# Patient Record
Sex: Male | Born: 1955 | Race: White | Hispanic: No | Marital: Single | Smoking: Never smoker
Health system: Southern US, Community
[De-identification: ages and names within clinical notes are randomized; demographics above are authoritative.]

## PROBLEM LIST (undated history)

## (undated) DIAGNOSIS — F29 Unspecified psychosis not due to a substance or known physiological condition: Secondary | ICD-10-CM

## (undated) DIAGNOSIS — S069X9A Unspecified intracranial injury with loss of consciousness of unspecified duration, initial encounter: Secondary | ICD-10-CM

## (undated) DIAGNOSIS — F209 Schizophrenia, unspecified: Secondary | ICD-10-CM

## (undated) DIAGNOSIS — F329 Major depressive disorder, single episode, unspecified: Secondary | ICD-10-CM

## (undated) DIAGNOSIS — F32A Depression, unspecified: Secondary | ICD-10-CM

## (undated) DIAGNOSIS — E785 Hyperlipidemia, unspecified: Secondary | ICD-10-CM

## (undated) DIAGNOSIS — N400 Enlarged prostate without lower urinary tract symptoms: Secondary | ICD-10-CM

## (undated) DIAGNOSIS — J45909 Unspecified asthma, uncomplicated: Secondary | ICD-10-CM

## (undated) DIAGNOSIS — I8390 Asymptomatic varicose veins of unspecified lower extremity: Secondary | ICD-10-CM

## (undated) DIAGNOSIS — S069XAA Unspecified intracranial injury with loss of consciousness status unknown, initial encounter: Secondary | ICD-10-CM

## (undated) HISTORY — PX: NASAL SINUS SURGERY: SHX719

## (undated) HISTORY — PX: TONSILLECTOMY: SUR1361

---

## 2013-08-24 ENCOUNTER — Emergency Department (HOSPITAL_COMMUNITY)
Admission: EM | Admit: 2013-08-24 | Discharge: 2013-08-24 | Disposition: A | Discharge status: Home / Self Care | Payer: Self-pay | Attending: Emergency Medicine | Admitting: Emergency Medicine

## 2013-08-24 ENCOUNTER — Encounter (HOSPITAL_COMMUNITY): Payer: Self-pay | Admitting: Emergency Medicine

## 2013-08-24 DIAGNOSIS — F329 Major depressive disorder, single episode, unspecified: Secondary | ICD-10-CM | POA: Insufficient documentation

## 2013-08-24 DIAGNOSIS — Z8639 Personal history of other endocrine, nutritional and metabolic disease: Secondary | ICD-10-CM | POA: Insufficient documentation

## 2013-08-24 DIAGNOSIS — Z880 Allergy status to penicillin: Secondary | ICD-10-CM | POA: Insufficient documentation

## 2013-08-24 DIAGNOSIS — N400 Enlarged prostate without lower urinary tract symptoms: Secondary | ICD-10-CM | POA: Insufficient documentation

## 2013-08-24 DIAGNOSIS — N390 Urinary tract infection, site not specified: Secondary | ICD-10-CM | POA: Insufficient documentation

## 2013-08-24 DIAGNOSIS — F3289 Other specified depressive episodes: Secondary | ICD-10-CM | POA: Insufficient documentation

## 2013-08-24 DIAGNOSIS — Z87820 Personal history of traumatic brain injury: Secondary | ICD-10-CM | POA: Insufficient documentation

## 2013-08-24 DIAGNOSIS — Z8659 Personal history of other mental and behavioral disorders: Secondary | ICD-10-CM | POA: Insufficient documentation

## 2013-08-24 DIAGNOSIS — J45909 Unspecified asthma, uncomplicated: Secondary | ICD-10-CM | POA: Insufficient documentation

## 2013-08-24 DIAGNOSIS — Z862 Personal history of diseases of the blood and blood-forming organs and certain disorders involving the immune mechanism: Secondary | ICD-10-CM | POA: Insufficient documentation

## 2013-08-24 DIAGNOSIS — IMO0002 Reserved for concepts with insufficient information to code with codable children: Secondary | ICD-10-CM | POA: Insufficient documentation

## 2013-08-24 DIAGNOSIS — Z79899 Other long term (current) drug therapy: Secondary | ICD-10-CM | POA: Insufficient documentation

## 2013-08-24 DIAGNOSIS — Z8679 Personal history of other diseases of the circulatory system: Secondary | ICD-10-CM | POA: Insufficient documentation

## 2013-08-24 HISTORY — DX: Unspecified intracranial injury with loss of consciousness status unknown, initial encounter: S06.9XAA

## 2013-08-24 HISTORY — DX: Unspecified psychosis not due to a substance or known physiological condition: F29

## 2013-08-24 HISTORY — DX: Depression, unspecified: F32.A

## 2013-08-24 HISTORY — DX: Asymptomatic varicose veins of unspecified lower extremity: I83.90

## 2013-08-24 HISTORY — DX: Benign prostatic hyperplasia without lower urinary tract symptoms: N40.0

## 2013-08-24 HISTORY — DX: Unspecified intracranial injury with loss of consciousness of unspecified duration, initial encounter: S06.9X9A

## 2013-08-24 HISTORY — DX: Major depressive disorder, single episode, unspecified: F32.9

## 2013-08-24 HISTORY — DX: Hyperlipidemia, unspecified: E78.5

## 2013-08-24 HISTORY — DX: Schizophrenia, unspecified: F20.9

## 2013-08-24 HISTORY — DX: Unspecified asthma, uncomplicated: J45.909

## 2013-08-24 LAB — URINALYSIS, ROUTINE W REFLEX MICROSCOPIC
Bilirubin Urine: NEGATIVE
Glucose, UA: NEGATIVE mg/dL
Ketones, ur: 15 mg/dL — AB
Nitrite: POSITIVE — AB
PROTEIN: 30 mg/dL — AB
SPECIFIC GRAVITY, URINE: 1.016 (ref 1.005–1.030)
UROBILINOGEN UA: 0.2 mg/dL (ref 0.0–1.0)
pH: 5.5 (ref 5.0–8.0)

## 2013-08-24 LAB — URINE MICROSCOPIC-ADD ON

## 2013-08-24 MED ORDER — CIPROFLOXACIN HCL 500 MG PO TABS: 500.0000 mg | ORAL_TABLET | Freq: Two times a day (BID) | ORAL | Status: DC

## 2013-08-24 NOTE — ED Provider Notes (Addendum)
CSN: [id number]     Arrival date & time 08/24/13  1314 History   First MD Initiated Contact with Patient 08/24/13 1546     Chief Complaint  Patient presents with  . Foley Catheter Issue      (Consider location/radiation/quality/duration/timing/severity/associated sxs/prior Treatment) HPI Comments: Patient states he's had a Foley catheter since January of this past year due to urinary retention from BPH. This all occurred while he was in prison and at that time he was told he had an elevated PSA but no further evaluation was done at that time. Patient has been taking terazosin and since being released from prison he has been unable to find a PCP or urologist who will see him. Patient states in the last week the catheter has become very cloudy and he removed it yesterday but when he developed urinary retention because he was unable to urinate he replaced the Foley and taped it and came today to be evaluated. He denies fever, abdominal or flank pain. No nausea or vomiting.  The history is provided by the patient.    Past Medical History  Diagnosis Date  . Asthma   . Hyperlipemia   . Varicose vein   . Traumatic brain injury   . BPH (benign prostatic hyperplasia)   . Schizophrenia   . Psychosis   . Depression    Past Surgical History  Procedure Laterality Date  . Nasal sinus surgery    . Tonsillectomy     No family history on file. History  Substance Use Topics  . Smoking status: Never Smoker   . Smokeless tobacco: Not on file  . Alcohol Use: No    Review of Systems  All other systems reviewed and are negative.      Allergies  Horse-derived products and Penicillins  Home Medications   Current Outpatient Rx  Name  Route  Sig  Dispense  Refill  . albuterol (PROVENTIL HFA;VENTOLIN HFA) 108 (90 BASE) MCG/ACT inhaler   Inhalation   Inhale 2 puffs into the lungs every 4 (four) hours as needed for wheezing or shortness of breath.         . beclomethasone (QVAR) 40  MCG/ACT inhaler   Inhalation   Inhale 2 puffs into the lungs 2 (two) times daily.         . fluticasone (FLONASE) 50 MCG/ACT nasal spray   Each Nare   Place 2 sprays into both nostrils daily.         . mirtazapine (REMERON) 15 MG tablet   Oral   Take 15 mg by mouth at bedtime.         . Skin Protectants, Misc. (EUCERIN) cream   Topical   Apply 1 application topically as needed for dry skin.         Marland Kitchen. terazosin (HYTRIN) 5 MG capsule   Oral   Take 5 mg by mouth at bedtime.         . triamcinolone (NASACORT ALLERGY 24HR) 55 MCG/ACT AERO nasal inhaler   Nasal   Place 2 sprays into the nose daily.          BP 146/85  Pulse 80  Temp(Src) 98.2 F (36.8 C) (Oral)  Resp 18  SpO2 100% Physical Exam  Nursing note and vitals reviewed. Constitutional: He is oriented to person, place, and time. He appears well-developed and well-nourished. No distress.  HENT:  Head: Normocephalic and atraumatic.  Mouth/Throat: Oropharynx is clear and moist.  Eyes: Conjunctivae and EOM are normal.  Pupils are equal, round, and reactive to light.  Neck: Normal range of motion. Neck supple.  Cardiovascular: Normal rate, regular rhythm and intact distal pulses.   No murmur heard. Pulmonary/Chest: Effort normal and breath sounds normal. No respiratory distress. He has no wheezes. He has no rales.  Abdominal: Soft. He exhibits no distension. There is no tenderness. There is no rebound and no guarding.  Genitourinary:  Foley in place with discolored cloudy urine in the bag.  Minor irritation of the glans  Musculoskeletal: Normal range of motion. He exhibits no edema and no tenderness.  Neurological: He is alert and oriented to person, place, and time.  Skin: Skin is warm and dry. No rash noted. No erythema.  Psychiatric: He has a normal mood and affect. His behavior is normal.    ED Course  Procedures (including critical care time) Labs Review Labs Reviewed  URINALYSIS, ROUTINE W REFLEX  MICROSCOPIC - Abnormal; Notable for the following:    APPearance CLOUDY (*)    Hgb urine dipstick LARGE (*)    Ketones, ur 15 (*)    Protein, ur 30 (*)    Nitrite POSITIVE (*)    Leukocytes, UA MODERATE (*)    All other components within normal limits  URINE MICROSCOPIC-ADD ON - Abnormal; Notable for the following:    Bacteria, UA MANY (*)    Casts HYALINE CASTS (*)    All other components within normal limits  URINE CULTURE   Imaging Review No results found.   EKG Interpretation None      MDM   Final diagnoses:  UTI (lower urinary tract infection)    Patient presenting with Foley catheter problems. He has had this Foley in place for the last 3 months and since being released from prison he has not been able to find someone to take care of it. He states the urine is cloudy and he removed the catheter yesterday but was unable to urinate and when he developed urinary retention symptoms he reinserted the old catheter. He denies any significant pain and is not displaying any signs of pyelonephritis. He is awake alert and vital signs are stable. Urine shows a urinary tract infection and a new Foley catheter was placed patient was placed on Cipro. Spoke with case management who got patient an appointment with her primary care physician and also worked on urology followup. All this was discussed with the patient and he was discharged home.    Gwyneth Sprout, MD 08/24/13 1759  Gwyneth Sprout, MD 08/24/13 [phone]

## 2013-08-24 NOTE — ED Notes (Signed)
Pt reports concern for infection to foley catheter, tubing is cloudy, burning/ulceration around tip of penis. Reports foley catheter was placed beginning of January while in prison. Has been taking herbs to help discomfort from foley. Pt reports last night he took the catheter out, bleached it and then reinserted it. Pt is a x 4.

## 2013-08-24 NOTE — ED Notes (Signed)
Spoke with CM about pt needing urologist appointment, but does not have insurance. CM will follow up

## 2013-08-24 NOTE — Discharge Instructions (Signed)
Catheter-Associated Urinary Tract Infection FAQs °WHAT IS "CATHETER-ASSOCIATED" URINARY TRACT INFECTION? °A urinary tract infection (also called "UTI") is an infection in the urinary system, which includes the bladder (which stores the urine) and the kidneys (which filter the blood to make urine). Germs (for example, bacteria or yeasts) do not normally live in these areas; but if germs are introduced, an infection can occur. If you have a urinary catheter, germs can travel along the catheter and cause an infection in your bladder or your kidney; in that case it is called a catheter-associated urinary tract infection (or "CA-UTI").  °WHAT IS A URINARY CATHETER? °A urinary catheter is a thin tube placed in the bladder to drain urine. Urine drains through the tube into a bag that collects the urine. A urinary  °catheter may be used: °· If you are not able to urinate on your own. °· To measure the amount of urine that you make, for example, during intensive care. °· During and after some types of surgery. °· During some tests of the kidneys and bladder . °People with urinary catheters have a much higher chance of getting a urinary tract infection than people who don't have a catheter. °HOW DO I GET A CATHETER-ASSOCIATED URINARY TRACT INFECTION (CA-UTI)? °If germs enter the urinary tract, they may cause an infection. Many of the germs that cause a catheter-associated urinary tract infection are common germs found in your intestines that do not usually cause an infection there. Germs can enter the urinary tract when the catheter is being put in or while the catheter remains in the bladder.  °WHAT ARE THE SYMPTOMS OF A URINARY TRACT INFECTION?  °Some of the common symptoms of a urinary tract infection are: °· Burning or pain in the lower abdomen (that is, below the stomach). °· Fever. °· Bloody urine may be a sign of infection, but is also caused by other problems . °· Burning during urination or an increase in the  frequency of urination after the catheter is removed. °Sometimes people with catheter-associated urinary tract infections do not have these symptoms of infection. °CAN CATHETER-ASSOCIATED URINARY TRACT INFECTIONS BE TREATED? °Yes, most catheter-associated urinary tract infections can be treated with antibiotics and removal or change of the catheter. Your doctor will determine which antibiotic is best for you.  °WHAT ARE SOME OF THE THINGS THAT HOSPITALS ARE DOING TO PREVENT CATHETER-ASSOCIATED URINARY TRACT INFECTIONS? °To prevent urinary tract infections, doctors and nurses take the following actions.  °Catheter insertion °· Catheters are put in only when necessary and they are removed as soon as possible. °· Only properly trained persons insert catheters using sterile ("clean") technique. °· The skin in the area where the catheter will be inserted is cleaned before inserting the catheter. °· Other methods to drain the urine are sometimes used, such as: °· External catheters in men (these look like condoms and are placed over the penis rather than into the penis) °· Putting a temporary catheter in to drain the urine and removing it right away. This is called intermittent urethral catheterization. °Catheter care °· Healthcare providers clean their hands by washing them with soap and water or using an alcohol-based hand rub before and after touching your catheter. °· If you do not see your providers clean their hands, please ask them to do so. °· Avoid disconnecting the catheter and drain tube. This helps to prevent germs from getting into the catheter tube. °· The catheter is secured to the leg to prevent pulling on the   catheter. °· Avoid twisting or kinking the catheter. °· Keep the bag lower than the bladder to prevent urine from backflowing to the bladder. °· Empty the bag regularly. The drainage spout should not touch anything while emptying the bag. °WHAT CAN I DO TO HELP PREVENT CATHETER-ASSOCIATED URINARY  TRACT INFECTIONS IF I HAVE A CATHETER? °· Always clean your hands before and after doing catheter care. °· Always keep your urine bag below the level of your bladder. °· Do not tug or pull on the tubing. °· Do not twist or kink the catheter tubing. °· Ask your healthcare provider each day if you still need the catheter. °WHAT DO I NEED TO DO WHEN I GO HOME FROM THE HOSPITAL? °· If you will be going home with a catheter, your doctor or nurse should explain everything you need to know about taking care of the catheter. Make sure you understand how to care for it before you leave the hospital. °· If you develop any of the symptoms of a urinary tract infection, such as burning or pain in the lower abdomen, fever, or an increase in the frequency of urination, contact your doctor or nurse immediately. °· Before you go home, make sure you know who to contact if you have questions or problems after you get home. °If you have questions, please ask your doctor or nurse. °Developed and co-sponsored by The Society for Healthcare Epidemiology of America (SHEA); Infectious Diseases Society of America (IDSA); The American Hospital Association; Association for Professionals in Infection Control and Epidemiology (APIC); Center for Disease Control (CDC); and The Joint Commission °Document Released: 02/20/2012 Document Reviewed: 02/20/2012 °Exit[employer]® Patient Information ©2014 [employer]. ° °

## 2013-08-25 LAB — URINE CULTURE: Colony Count: >=100000

## 2013-08-29 ENCOUNTER — Encounter: Payer: Self-pay | Admitting: Internal Medicine

## 2013-08-29 ENCOUNTER — Ambulatory Visit: Payer: Self-pay | Attending: Internal Medicine | Admitting: Internal Medicine

## 2013-08-29 VITALS — BP 137/83 | HR 83 | Temp 97.7°F | Ht 69.0 in | Wt 144.2 lb

## 2013-08-29 DIAGNOSIS — E785 Hyperlipidemia, unspecified: Secondary | ICD-10-CM | POA: Insufficient documentation

## 2013-08-29 DIAGNOSIS — J45909 Unspecified asthma, uncomplicated: Secondary | ICD-10-CM

## 2013-08-29 DIAGNOSIS — Z87820 Personal history of traumatic brain injury: Secondary | ICD-10-CM | POA: Insufficient documentation

## 2013-08-29 DIAGNOSIS — F3289 Other specified depressive episodes: Secondary | ICD-10-CM | POA: Insufficient documentation

## 2013-08-29 DIAGNOSIS — Z008 Encounter for other general examination: Secondary | ICD-10-CM | POA: Insufficient documentation

## 2013-08-29 DIAGNOSIS — F329 Major depressive disorder, single episode, unspecified: Secondary | ICD-10-CM | POA: Insufficient documentation

## 2013-08-29 DIAGNOSIS — Z79899 Other long term (current) drug therapy: Secondary | ICD-10-CM | POA: Insufficient documentation

## 2013-08-29 DIAGNOSIS — F29 Unspecified psychosis not due to a substance or known physiological condition: Secondary | ICD-10-CM | POA: Insufficient documentation

## 2013-08-29 DIAGNOSIS — F209 Schizophrenia, unspecified: Secondary | ICD-10-CM | POA: Insufficient documentation

## 2013-08-29 DIAGNOSIS — N400 Enlarged prostate without lower urinary tract symptoms: Secondary | ICD-10-CM

## 2013-08-29 DIAGNOSIS — R339 Retention of urine, unspecified: Secondary | ICD-10-CM | POA: Insufficient documentation

## 2013-08-29 LAB — COMPLETE METABOLIC PANEL WITH GFR
ALBUMIN: 3.9 g/dL (ref 3.5–5.2)
ALT: 13 U/L (ref 0–53)
AST: 27 U/L (ref 0–37)
Alkaline Phosphatase: 94 U/L (ref 39–117)
BUN: 12 mg/dL (ref 6–23)
CO2: 28 meq/L (ref 19–32)
Calcium: 9 mg/dL (ref 8.4–10.5)
Chloride: 105 mEq/L (ref 96–112)
Creat: 1.01 mg/dL (ref 0.50–1.35)
GFR, EST NON AFRICAN AMERICAN: 81 mL/min
GLUCOSE: 80 mg/dL (ref 70–99)
POTASSIUM: 3.9 meq/L (ref 3.5–5.3)
Sodium: 140 mEq/L (ref 135–145)
TOTAL PROTEIN: 6.7 g/dL (ref 6.0–8.3)
Total Bilirubin: 0.7 mg/dL (ref 0.2–1.2)

## 2013-08-29 LAB — LIPID PANEL
CHOL/HDL RATIO: 4.6 ratio
Cholesterol: 215 mg/dL — ABNORMAL HIGH (ref 0–200)
HDL: 47 mg/dL (ref >39)
LDL CALC: 154 mg/dL — AB (ref 0–99)
Triglycerides: 71 mg/dL (ref <150)
VLDL: 14 mg/dL (ref 0–40)

## 2013-08-29 LAB — POCT URINALYSIS DIPSTICK
BILIRUBIN UA: NEGATIVE
GLUCOSE UA: NEGATIVE
KETONES UA: NEGATIVE
Nitrite, UA: NEGATIVE
SPEC GRAV UA: 1.02
Urobilinogen, UA: 0.2
pH, UA: 5

## 2013-08-29 LAB — CBC WITH DIFFERENTIAL/PLATELET
BASOS PCT: 1 % (ref 0–1)
Basophils Absolute: 0.1 10*3/uL (ref 0.0–0.1)
Eosinophils Absolute: 0.8 10*3/uL — ABNORMAL HIGH (ref 0.0–0.7)
Eosinophils Relative: 15 % — ABNORMAL HIGH (ref 0–5)
HEMATOCRIT: 38.8 % — AB (ref 39.0–52.0)
HEMOGLOBIN: 13.5 g/dL (ref 13.0–17.0)
LYMPHS ABS: 1 10*3/uL (ref 0.7–4.0)
LYMPHS PCT: 18 % (ref 12–46)
MCH: 29.7 pg (ref 26.0–34.0)
MCHC: 34.8 g/dL (ref 30.0–36.0)
MCV: 85.3 fL (ref 78.0–100.0)
MONO ABS: 0.4 10*3/uL (ref 0.1–1.0)
MONOS PCT: 8 % (ref 3–12)
NEUTROS ABS: 3.1 10*3/uL (ref 1.7–7.7)
Neutrophils Relative %: 58 % (ref 43–77)
Platelets: 323 10*3/uL (ref 150–400)
RBC: 4.55 MIL/uL (ref 4.22–5.81)
RDW: 12.8 % (ref 11.5–15.5)
WBC: 5.3 10*3/uL (ref 4.0–10.5)

## 2013-08-29 LAB — TSH: TSH: 1.578 u[IU]/mL (ref 0.350–4.500)

## 2013-08-29 MED ORDER — FLUTICASONE PROPIONATE 50 MCG/ACT NA SUSP: 2.0000 | Freq: Every day | NASAL | Status: DC

## 2013-08-29 MED ORDER — FINASTERIDE 5 MG PO TABS: 5.0000 mg | ORAL_TABLET | Freq: Every day | ORAL | Status: DC

## 2013-08-29 MED ORDER — MIRTAZAPINE 15 MG PO TABS
15.0000 mg | ORAL_TABLET | Freq: Every day | ORAL | Status: DC
Start: 2013-08-29 — End: 2022-09-20

## 2013-08-29 MED ORDER — BECLOMETHASONE DIPROPIONATE 40 MCG/ACT IN AERS: 2.0000 | INHALATION_SPRAY | Freq: Two times a day (BID) | RESPIRATORY_TRACT | Status: DC

## 2013-08-29 MED ORDER — ALBUTEROL SULFATE HFA 108 (90 BASE) MCG/ACT IN AERS: 2.0000 | INHALATION_SPRAY | RESPIRATORY_TRACT | Status: DC | PRN

## 2013-08-29 NOTE — Progress Notes (Signed)
Patient ID: Grant Bullock, male   DOB: [birth year], 58 y.o.   MRN: [id number]   CC:  HPI: 58 year old male here to establish care. The patient has a history of having BPH. 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 This all occurred while he was in prison and at that time he was told he had an elevated PSA ranging from 6.5-8.5 but no further evaluation was done at that time. Patient has been taking terazosin and since being released from prison he has been unable to find a PCP or urologist who will see him. Patient states in the last week the catheter has become very cloudy. He was seen in the ER on 3/16 and was found to have Klebsiella UTI and was treated with ciprofloxacin.  Patient had his Foley catheter replaced. The patient denies any flank pain hematuria dysuria. Symptoms of urinary tract infection have resolved.  He has a history of asthmatic bronchitis but her symptoms are well controlled  Social history nonsmoker nonalcoholic  Family history father had alcoholism and died of prostate cancer   Allergies  Allergen Reactions  . Horse-Derived Products Other (See Comments)    Reaction unknown  . Penicillins Other (See Comments)    Reaction unknown   Past Medical History  Diagnosis Date  . Asthma   . Hyperlipemia   . Varicose vein   . Traumatic brain injury   . BPH (benign prostatic hyperplasia)   . Schizophrenia   . Psychosis   . Depression    Current Outpatient Prescriptions on File Prior to Visit  Medication Sig Dispense Refill  . ciprofloxacin (CIPRO) 500 MG tablet Take 1 tablet (500 mg total) by mouth 2 (two) times daily.  20 tablet  0  . Skin Protectants, Misc. (EUCERIN) cream Apply 1 application topically as needed for dry skin.      Marland Kitchen triamcinolone (NASACORT ALLERGY 24HR) 55 MCG/ACT AERO nasal inhaler Place 2 sprays into the nose daily.       No current facility-administered medications on file prior to  visit.   No family history on file. History   Social History  . Marital Status: Divorced    Spouse Name: N/A    Number of Children: N/A  . Years of Education: N/A   Occupational History  . Not on file.   Social History Main Topics  . Smoking status: Never Smoker   . Smokeless tobacco: Not on file  . Alcohol Use: No  . Drug Use: No  . Sexual Activity: Not on file   Other Topics Concern  . Not on file   Social History Narrative  . No narrative on file    Review of Systems  Constitutional: Negative for fever, chills, diaphoresis, activity change, appetite change and fatigue.  HENT: Negative for ear pain, nosebleeds, congestion, facial swelling, rhinorrhea, neck pain, neck stiffness and ear discharge.   Eyes: Negative for pain, discharge, redness, itching and visual disturbance.  Respiratory: Negative for cough, choking, chest tightness, shortness of breath, wheezing and stridor.   Cardiovascular: Negative for chest pain, palpitations and leg swelling.  Gastrointestinal: Negative for abdominal distention.  Genitourinary: Negative for dysuria, urgency, frequency, hematuria, flank pain, decreased urine volume, difficulty urinating and dyspareunia.  Musculoskeletal: Negative for back pain, joint swelling, arthralgias and gait problem.  Neurological: Negative for dizziness, tremors, seizures, syncope, facial asymmetry, speech difficulty, weakness, light-headedness, numbness and headaches.  Hematological: Negative for adenopathy. Does not bruise/bleed easily.  Psychiatric/Behavioral: Negative for hallucinations, behavioral problems, confusion, dysphoric mood, decreased concentration and agitation.    Objective:   Filed Vitals:   08/29/13 0923  BP: 137/83  Pulse: 83  Temp: 97.7 F (36.5 C)    Physical Exam  Constitutional: Appears well-developed and well-nourished. No distress.  HENT: Normocephalic. External right and left ear normal. Oropharynx is clear and moist.  Eyes:  Conjunctivae and EOM are normal. PERRLA, no scleral icterus.  Neck: Normal ROM. Neck supple. No JVD. No tracheal deviation. No thyromegaly.  CVS: RRR, S1/S2 +, no murmurs, no gallops, no carotid bruit.  Pulmonary: Effort and breath sounds normal, no stridor, rhonchi, wheezes, rales.  Abdominal: Soft. BS +,  no distension, tenderness, rebound or guarding.  Musculoskeletal: Normal range of motion. No edema and no tenderness.  Lymphadenopathy: No lymphadenopathy noted, cervical, inguinal. Neuro: Alert. Normal reflexes, muscle tone coordination. No cranial nerve deficit. Skin: Skin is warm and dry. No rash noted. Not diaphoretic. No erythema. No pallor.  Psychiatric: Normal mood and affect. Behavior, judgment, thought content normal.   No results found for this basename: WBC, HGB, HCT, MCV, PLT   No results found for this basename: CREATININE, BUN, NA, K, CL, CO2    No results found for this basename: HGBA1C   Lipid Panel  No results found for this basename: chol, trig, hdl, cholhdl, vldl, ldlcalc       Assessment and plan:   There are no active problems to display for this patient.  Asthmatic bronchitis Refills provided for albuterol, Qvar   Urinary retention/BPH Patient orthostatic with Terazosin and has been switched to Proscar Urology referral provided Patient has an indwelling Foley catheter currently   Establish care Baseline labs GI referral for routine colonoscopy Patient up-to-date with his immunization in present  Patient to follow up in 3 months         The patient was given clear instructions to go to ER or return to medical center if symptoms don't improve, worsen or new problems develop. The patient verbalized understanding. The patient was told to call to get any lab results if not heard anything in the next week.

## 2013-08-29 NOTE — Progress Notes (Signed)
Patient here for post follow-up Hospital, [facility]. Infection, pain scale 0-10 = 1.

## 2013-09-08 ENCOUNTER — Telehealth: Payer: Self-pay | Admitting: Emergency Medicine

## 2013-09-08 NOTE — Telephone Encounter (Signed)
Left message for pt to call for blood results when received

## 2013-09-08 NOTE — Telephone Encounter (Signed)
Message copied by Darlis LoanSMITH, JILL D on Tue Sep 08, 2013  2:19 PM ------      Message from: Susie CassetteABROL MD, [facility]      Created: Wed Sep 02, 2013 11:07 AM       Notify patient of the labs are negative ------

## 2013-09-10 ENCOUNTER — Telehealth: Payer: Self-pay | Admitting: Emergency Medicine

## 2013-09-10 NOTE — Telephone Encounter (Signed)
Pt called in wanting blood results. Results given

## 2013-09-15 ENCOUNTER — Ambulatory Visit: Payer: Self-pay | Attending: Internal Medicine

## 2013-10-01 ENCOUNTER — Encounter: Payer: Self-pay | Admitting: Internal Medicine

## 2013-10-13 ENCOUNTER — Ambulatory Visit (INDEPENDENT_AMBULATORY_CARE_PROVIDER_SITE_OTHER): Payer: Self-pay | Admitting: Urology

## 2013-10-13 DIAGNOSIS — N401 Enlarged prostate with lower urinary tract symptoms: Secondary | ICD-10-CM

## 2013-10-13 DIAGNOSIS — R972 Elevated prostate specific antigen [PSA]: Secondary | ICD-10-CM

## 2013-10-13 DIAGNOSIS — R339 Retention of urine, unspecified: Secondary | ICD-10-CM

## 2013-12-01 ENCOUNTER — Ambulatory Visit (INDEPENDENT_AMBULATORY_CARE_PROVIDER_SITE_OTHER): Payer: Self-pay | Admitting: Urology

## 2013-12-01 DIAGNOSIS — R339 Retention of urine, unspecified: Secondary | ICD-10-CM

## 2013-12-01 DIAGNOSIS — R972 Elevated prostate specific antigen [PSA]: Secondary | ICD-10-CM

## 2013-12-01 DIAGNOSIS — N320 Bladder-neck obstruction: Secondary | ICD-10-CM

## 2013-12-01 DIAGNOSIS — R82998 Other abnormal findings in urine: Secondary | ICD-10-CM

## 2013-12-02 DIAGNOSIS — Z029 Encounter for administrative examinations, unspecified: Secondary | ICD-10-CM

## 2014-02-24 ENCOUNTER — Ambulatory Visit: Payer: Self-pay | Attending: Internal Medicine | Admitting: Internal Medicine

## 2014-02-24 ENCOUNTER — Encounter: Payer: Self-pay | Admitting: Internal Medicine

## 2014-02-24 ENCOUNTER — Ambulatory Visit: Payer: Self-pay | Admitting: Internal Medicine

## 2014-02-24 VITALS — BP 130/80 | HR 90 | Temp 98.2°F | Resp 16 | Wt 138.6 lb

## 2014-02-24 DIAGNOSIS — R0989 Other specified symptoms and signs involving the circulatory and respiratory systems: Secondary | ICD-10-CM | POA: Insufficient documentation

## 2014-02-24 DIAGNOSIS — R0981 Nasal congestion: Secondary | ICD-10-CM | POA: Insufficient documentation

## 2014-02-24 DIAGNOSIS — N400 Enlarged prostate without lower urinary tract symptoms: Secondary | ICD-10-CM | POA: Insufficient documentation

## 2014-02-24 DIAGNOSIS — J4520 Mild intermittent asthma, uncomplicated: Secondary | ICD-10-CM

## 2014-02-24 DIAGNOSIS — J45909 Unspecified asthma, uncomplicated: Secondary | ICD-10-CM | POA: Insufficient documentation

## 2014-02-24 DIAGNOSIS — H6120 Impacted cerumen, unspecified ear: Secondary | ICD-10-CM | POA: Insufficient documentation

## 2014-02-24 DIAGNOSIS — F32A Depression, unspecified: Secondary | ICD-10-CM

## 2014-02-24 DIAGNOSIS — H6122 Impacted cerumen, left ear: Secondary | ICD-10-CM

## 2014-02-24 DIAGNOSIS — R0609 Other forms of dyspnea: Secondary | ICD-10-CM | POA: Insufficient documentation

## 2014-02-24 DIAGNOSIS — K4090 Unilateral inguinal hernia, without obstruction or gangrene, not specified as recurrent: Secondary | ICD-10-CM | POA: Insufficient documentation

## 2014-02-24 DIAGNOSIS — Z0000 Encounter for general adult medical examination without abnormal findings: Secondary | ICD-10-CM

## 2014-02-24 DIAGNOSIS — Z1211 Encounter for screening for malignant neoplasm of colon: Secondary | ICD-10-CM

## 2014-02-24 DIAGNOSIS — F3289 Other specified depressive episodes: Secondary | ICD-10-CM | POA: Insufficient documentation

## 2014-02-24 DIAGNOSIS — F329 Major depressive disorder, single episode, unspecified: Secondary | ICD-10-CM

## 2014-02-24 DIAGNOSIS — J3489 Other specified disorders of nose and nasal sinuses: Secondary | ICD-10-CM | POA: Insufficient documentation

## 2014-02-24 MED ORDER — CARBAMIDE PEROXIDE 6.5 % OT SOLN: 5.0000 [drp] | Freq: Two times a day (BID) | OTIC | Status: DC

## 2014-02-24 NOTE — Progress Notes (Signed)
Patient here for his annual physical Needs documentation stating that he has significant health Issues that are not going to improve any time soon for his disability case

## 2014-02-24 NOTE — Progress Notes (Signed)
Patient Demographics  Grant Bullock, is a 58 y.o. male  ZOX:[id number]  [id number]  DOB - Oct 26, 1955  CC:  Chief Complaint  Patient presents with  . Annual Exam       HPI: Grant Bullock is a 58 y.o. male here today for annual physical examination. Patient has history of BPH/urinary retention, asthma, he reported that he follows with the urologist currently taking Proscar and he does self intermittent catheterization, patient is using Qvar and albuterol when necessary for asthma, also history of anxiety/depression currently following up with [facility] and is on Remeron, denies any SI or HI, patient reported to have some exertional shortness of breath ? He was told in the past that he has enlarged heart, denies any orthopnea PND or leg swelling. Patient also reported to have right inguinal hernia notice for several months denies any pain nausea vomiting any change in bowel habits. Patient has No headache, No chest pain, No abdominal pain - No Nausea, No new weakness tingling or numbness, No Cough - SOB.  Allergies  Allergen Reactions  . Horse-Derived Products Other (See Comments)    Reaction unknown  . Penicillins Other (See Comments)    Reaction unknown   Past Medical History  Diagnosis Date  . Asthma   . Hyperlipemia   . Varicose vein   . Traumatic brain injury   . BPH (benign prostatic hyperplasia)   . Schizophrenia   . Psychosis   . Depression    Current Outpatient Prescriptions on File Prior to Visit  Medication Sig Dispense Refill  . albuterol (PROVENTIL HFA;VENTOLIN HFA) 108 (90 BASE) MCG/ACT inhaler Inhale 2 puffs into the lungs every 4 (four) hours as needed for wheezing or shortness of breath.  1 Inhaler  2  . beclomethasone (QVAR) 40 MCG/ACT inhaler Inhale 2 puffs into the lungs 2 (two) times daily.  1 Inhaler  3  . ciprofloxacin (CIPRO) 500 MG tablet Take 1 tablet (500 mg total) by mouth 2 (two) times daily.  20 tablet  0  . finasteride (PROSCAR) 5 MG  tablet Take 1 tablet (5 mg total) by mouth daily.  30 tablet  3  . fluticasone (FLONASE) 50 MCG/ACT nasal spray Place 2 sprays into both nostrils daily.  16 g  3  . mirtazapine (REMERON) 15 MG tablet Take 1 tablet (15 mg total) by mouth at bedtime.  30 tablet  3  . Skin Protectants, Misc. (EUCERIN) cream Apply 1 application topically as needed for dry skin.      Marland Kitchen triamcinolone (NASACORT ALLERGY 24HR) 55 MCG/ACT AERO nasal inhaler Place 2 sprays into the nose daily.      . Triamcinolone Acetonide (TRIAMCINOLONE 0.1 % CREAM : EUCERIN) CREA Apply 1 application topically as needed.       No current facility-administered medications on file prior to visit.   No family history on file. History   Social History  . Marital Status: Divorced    Spouse Name: N/A    Number of Children: N/A  . Years of Education: N/A   Occupational History  . Not on file.   Social History Main Topics  . Smoking status: Never Smoker   . Smokeless tobacco: Not on file  . Alcohol Use: No  . Drug Use: No  . Sexual Activity: Not on file   Other Topics Concern  . Not on file   Social History Narrative  . No narrative on file    Review of Systems: Constitutional: Negative for fever, chills,  diaphoresis, activity change, appetite change and fatigue. HENT: Negative for ear pain, nosebleeds, congestion, facial swelling, rhinorrhea, neck pain, neck stiffness and ear discharge.  Eyes: Negative for pain, discharge, redness, itching and visual disturbance. Respiratory: Negative for cough, choking, chest tightness, shortness of breath, wheezing and stridor.  Cardiovascular: Negative for chest pain, palpitations and leg swelling. Gastrointestinal: Negative for abdominal distention. Genitourinary: Negative for dysuria, urgency, frequency, hematuria, flank pain, decreased urine volume, difficulty urinating and dyspareunia.  Musculoskeletal: Negative for back pain, joint swelling, arthralgia and gait  problem. Neurological: Negative for dizziness, tremors, seizures, syncope, facial asymmetry, speech difficulty, weakness, light-headedness, numbness and headaches.  Hematological: Negative for adenopathy. Does not bruise/bleed easily. Psychiatric/Behavioral: Negative for hallucinations, behavioral problems, confusion, dysphoric mood, decreased concentration and agitation.    Objective:   Filed Vitals:   02/24/14 1525  BP: 130/80  Pulse:   Temp:   Resp:     Physical Exam: Constitutional: Patient appears well-developed and well-nourished. No distress. HENT: Normocephalic, atraumatic, External right and left ear normal. Oropharynx is clear and moist.  Eyes: Conjunctivae and EOM are normal. PERRLA, no scleral icterus. Neck: Normal ROM. Neck supple. No JVD. No tracheal deviation. No thyromegaly. CVS: RRR, S1/S2 +, no murmurs, no gallops, no carotid bruit.  Pulmonary: Effort and breath sounds normal, no stridor, rhonchi, wheezes, rales.  Abdominal: Soft. BS +, no distension, tenderness, rebound or guarding.right inguinal inguinal hernia reducible non tender  Musculoskeletal: Normal range of motion. No edema and no tenderness.  Neuro: Alert. Normal reflexes, muscle tone coordination. No cranial nerve deficit. Skin: Skin is warm and dry. No rash noted. Not diaphoretic. No erythema. No pallor. Psychiatric: Normal mood and affect. Behavior, judgment, thought content normal.  Lab Results  Component Value Date   WBC 5.3 08/29/2013   HGB 13.5 08/29/2013   HCT 38.8* 08/29/2013   MCV 85.3 08/29/2013   PLT 323 08/29/2013   Lab Results  Component Value Date   CREATININE 1.01 08/29/2013   BUN 12 08/29/2013   NA 140 08/29/2013   K 3.9 08/29/2013   CL 105 08/29/2013   CO2 28 08/29/2013    No results found for this basename: HGBA1C   Lipid Panel     Component Value Date/Time   CHOL 215* 08/29/2013 1001   TRIG 71 08/29/2013 1001   HDL 47 08/29/2013 1001   CHOLHDL 4.6 08/29/2013 1001   VLDL 14  08/29/2013 1001   LDLCALC 154* 08/29/2013 1001       Assessment and plan:   1. Annual physical exam Patient already had blood work done this year which was reviewed with the patient.  2. BPH (benign prostatic hyperplasia) Currently on Proscar following up with the urologist.  3. Asthmatic bronchitis, mild intermittent, uncomplicated Continue with Qvar and albuterol when necessary.  4. Depression Currently on Remeron following up with [facility].  5. Nasal congestion Patient is on Flonase.  6. Excess ear wax, left Prescribed - carbamide peroxide (DEBROX) 6.5 % otic solution; Place 5 drops into both ears 2 (two) times daily.  Dispense: 15 mL; Refill: 1  7. Right inguinal hernia Have discussed about referral to see a surgeon patient wants to hold off at this point and will let us know.  8. DOE (dyspnea on exertion) Ordered - 2D Echocardiogram  study; Future      Health Maintenance -Colonoscopy: referred to GI  -Vaccinations: Patient declines flu shot and pneumonia shot  Follow up in 3 months.   Doris Cheadle, MD

## 2014-02-26 ENCOUNTER — Ambulatory Visit (HOSPITAL_COMMUNITY)
Admission: RE | Admit: 2014-02-26 | Discharge: 2014-02-26 | Disposition: A | Discharge status: Home / Self Care | Payer: Self-pay | Source: Ambulatory Visit | Attending: Internal Medicine | Admitting: Internal Medicine

## 2014-02-26 DIAGNOSIS — R0609 Other forms of dyspnea: Secondary | ICD-10-CM

## 2014-02-26 DIAGNOSIS — R0989 Other specified symptoms and signs involving the circulatory and respiratory systems: Secondary | ICD-10-CM

## 2014-02-26 NOTE — Progress Notes (Signed)
  Echocardiogram 2D Echocardiogram has been performed.  Grant Bullock 02/26/2014, 2:28 PM

## 2014-03-30 ENCOUNTER — Ambulatory Visit (INDEPENDENT_AMBULATORY_CARE_PROVIDER_SITE_OTHER): Payer: Self-pay | Admitting: Urology

## 2014-03-30 DIAGNOSIS — N390 Urinary tract infection, site not specified: Secondary | ICD-10-CM

## 2014-03-30 DIAGNOSIS — R339 Retention of urine, unspecified: Secondary | ICD-10-CM

## 2014-03-30 DIAGNOSIS — R972 Elevated prostate specific antigen [PSA]: Secondary | ICD-10-CM

## 2014-03-30 DIAGNOSIS — N320 Bladder-neck obstruction: Secondary | ICD-10-CM

## 2014-08-03 ENCOUNTER — Ambulatory Visit (INDEPENDENT_AMBULATORY_CARE_PROVIDER_SITE_OTHER): Payer: Self-pay | Admitting: Urology

## 2014-08-03 DIAGNOSIS — R339 Retention of urine, unspecified: Secondary | ICD-10-CM

## 2014-08-03 DIAGNOSIS — N320 Bladder-neck obstruction: Secondary | ICD-10-CM

## 2014-08-03 DIAGNOSIS — R972 Elevated prostate specific antigen [PSA]: Secondary | ICD-10-CM

## 2015-01-14 ENCOUNTER — Ambulatory Visit: Payer: Self-pay | Attending: Internal Medicine

## 2015-01-25 ENCOUNTER — Ambulatory Visit (INDEPENDENT_AMBULATORY_CARE_PROVIDER_SITE_OTHER): Payer: Self-pay | Admitting: Urology

## 2015-01-25 DIAGNOSIS — R972 Elevated prostate specific antigen [PSA]: Secondary | ICD-10-CM

## 2015-01-25 DIAGNOSIS — N390 Urinary tract infection, site not specified: Secondary | ICD-10-CM

## 2015-01-25 DIAGNOSIS — N320 Bladder-neck obstruction: Secondary | ICD-10-CM

## 2015-08-22 DIAGNOSIS — Z5900 Homelessness unspecified: Secondary | ICD-10-CM

## 2015-08-22 DIAGNOSIS — F32A Depression, unspecified: Secondary | ICD-10-CM

## 2015-08-22 DIAGNOSIS — F329 Major depressive disorder, single episode, unspecified: Secondary | ICD-10-CM

## 2015-08-22 DIAGNOSIS — J45909 Unspecified asthma, uncomplicated: Secondary | ICD-10-CM

## 2015-08-22 DIAGNOSIS — N400 Enlarged prostate without lower urinary tract symptoms: Secondary | ICD-10-CM

## 2015-08-22 NOTE — Congregational Nurse Program (Signed)
Spoke with client in CN office.  Client without suicidal or homicidal ideation.  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  Client is cheerful and pleasant.  Client indicated that he is going to be asked to leave [facility] by November 10, 2015.  Client is working with a case management person at [facility] for shelter. Client also given a copy of a business card from [facility].  No further complaints.

## 2015-08-29 DIAGNOSIS — Z5900 Homelessness unspecified: Secondary | ICD-10-CM

## 2015-08-29 DIAGNOSIS — F329 Major depressive disorder, single episode, unspecified: Secondary | ICD-10-CM

## 2015-08-29 DIAGNOSIS — N400 Enlarged prostate without lower urinary tract symptoms: Secondary | ICD-10-CM

## 2015-08-29 DIAGNOSIS — J45909 Unspecified asthma, uncomplicated: Secondary | ICD-10-CM

## 2015-08-29 DIAGNOSIS — F32A Depression, unspecified: Secondary | ICD-10-CM

## 2015-08-29 NOTE — Congregational Nurse Program (Signed)
Client to visit CN.  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  Felipa EthFran Pearson and Andreas BlowerJay Poole contacted via email.  CN said he would be informed when the position is posted.

## 2016-02-28 ENCOUNTER — Ambulatory Visit (INDEPENDENT_AMBULATORY_CARE_PROVIDER_SITE_OTHER): Payer: Self-pay | Admitting: Urology

## 2016-02-28 ENCOUNTER — Other Ambulatory Visit: Payer: Self-pay | Admitting: Urology

## 2016-02-28 ENCOUNTER — Other Ambulatory Visit (HOSPITAL_COMMUNITY)
Admission: RE | Admit: 2016-02-28 | Discharge: 2016-02-28 | Disposition: A | Discharge status: Home / Self Care | Payer: Self-pay | Source: Ambulatory Visit | Attending: Urology | Admitting: Urology

## 2016-02-28 DIAGNOSIS — N401 Enlarged prostate with lower urinary tract symptoms: Secondary | ICD-10-CM | POA: Insufficient documentation

## 2016-02-28 DIAGNOSIS — R972 Elevated prostate specific antigen [PSA]: Secondary | ICD-10-CM

## 2016-02-28 DIAGNOSIS — N138 Other obstructive and reflux uropathy: Secondary | ICD-10-CM

## 2016-03-03 LAB — URINE CULTURE

## 2016-04-10 ENCOUNTER — Ambulatory Visit (INDEPENDENT_AMBULATORY_CARE_PROVIDER_SITE_OTHER): Payer: Self-pay | Admitting: Urology

## 2016-04-10 ENCOUNTER — Ambulatory Visit (HOSPITAL_COMMUNITY)
Admission: RE | Admit: 2016-04-10 | Discharge: 2016-04-10 | Disposition: A | Discharge status: Home / Self Care | Payer: Self-pay | Source: Ambulatory Visit | Attending: Urology | Admitting: Urology

## 2016-04-10 DIAGNOSIS — R3914 Feeling of incomplete bladder emptying: Secondary | ICD-10-CM

## 2016-04-10 DIAGNOSIS — N401 Enlarged prostate with lower urinary tract symptoms: Secondary | ICD-10-CM | POA: Insufficient documentation

## 2016-04-10 DIAGNOSIS — N138 Other obstructive and reflux uropathy: Secondary | ICD-10-CM | POA: Insufficient documentation

## 2016-04-10 DIAGNOSIS — R972 Elevated prostate specific antigen [PSA]: Secondary | ICD-10-CM

## 2016-04-11 ENCOUNTER — Other Ambulatory Visit: Payer: Self-pay | Admitting: Urology

## 2016-04-11 DIAGNOSIS — N138 Other obstructive and reflux uropathy: Secondary | ICD-10-CM

## 2016-04-11 DIAGNOSIS — N401 Enlarged prostate with lower urinary tract symptoms: Principal | ICD-10-CM

## 2016-04-24 ENCOUNTER — Ambulatory Visit (HOSPITAL_COMMUNITY): Admission: RE | Admit: 2016-04-24 | Payer: Self-pay | Source: Ambulatory Visit

## 2018-02-17 IMAGING — US US PELVIS LIMITED
1 series · 14 of 14 positions shown · non-contrast
Comparison: None.

CLINICAL DATA: BPH.

EXAM:
TRANSABDOMINAL ULTRASOUND OF PELVIS
TECHNIQUE: Transabdominal ultrasound examination of the pelvis was performed.

[Series 1: us pelvis limited · 0.22mm/px · 14 of 14 slices shown]
[im 1/14]
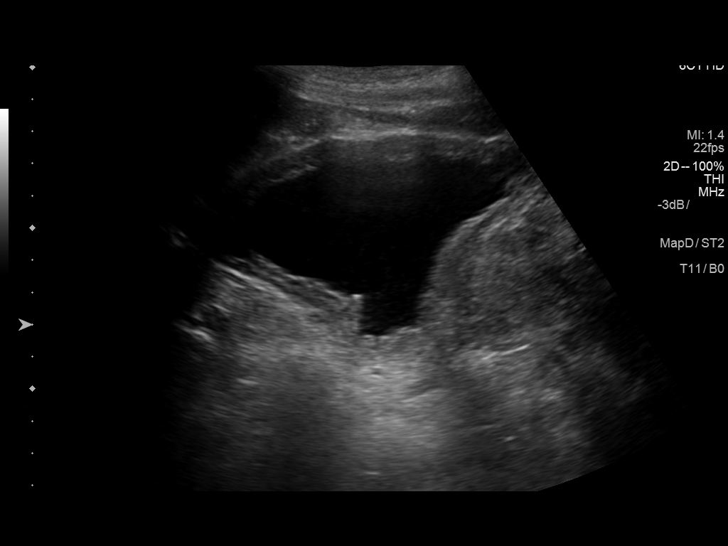
[im 2/14]
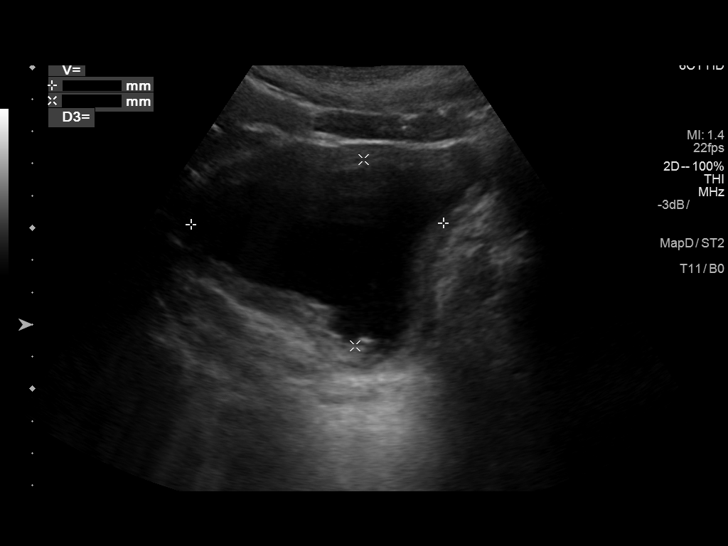
[im 3/14]
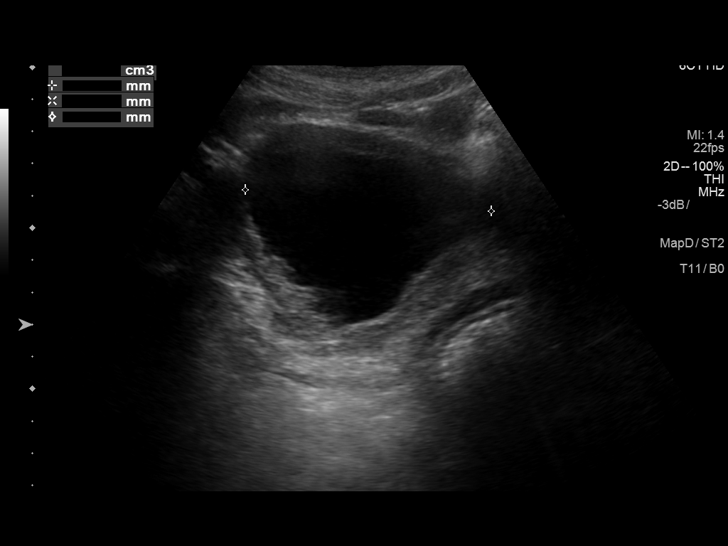
[im 4/14]
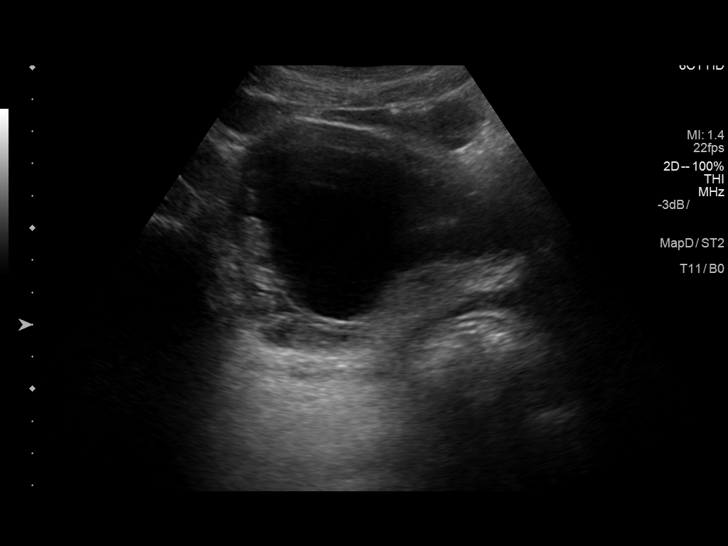
[im 5/14]
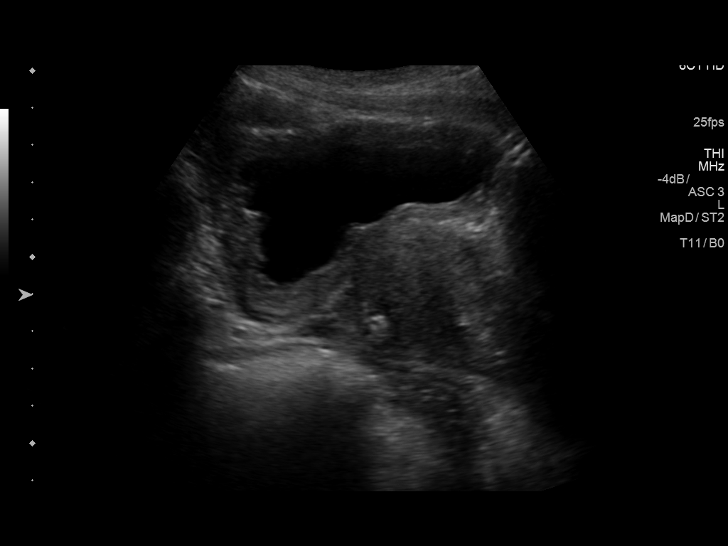
[im 6/14]
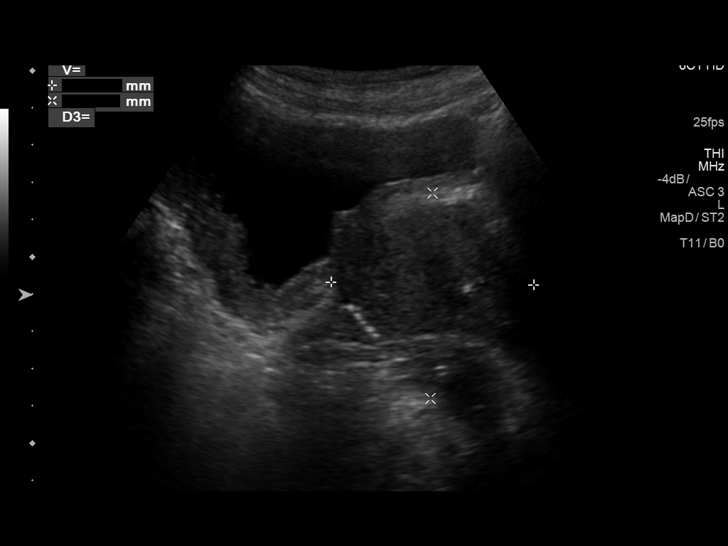
[im 7/14]
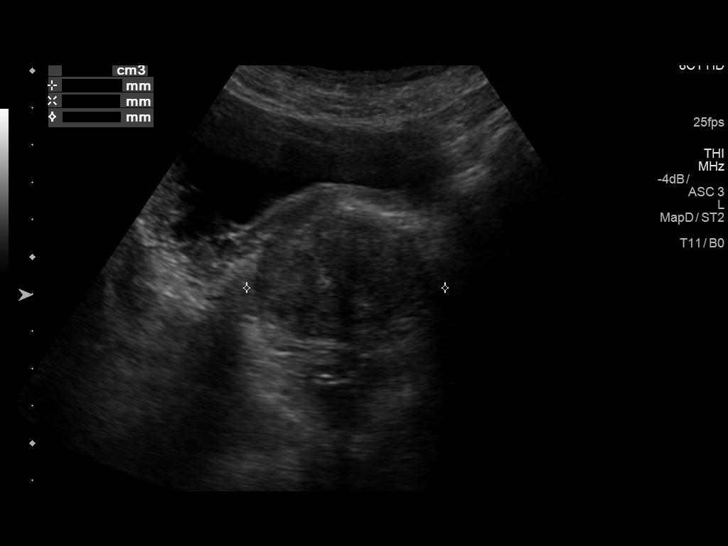
[im 8/14]
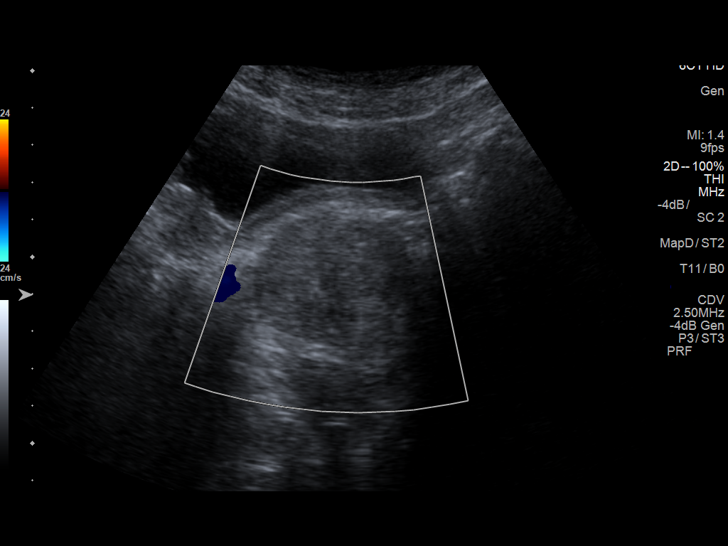
[im 9/14]
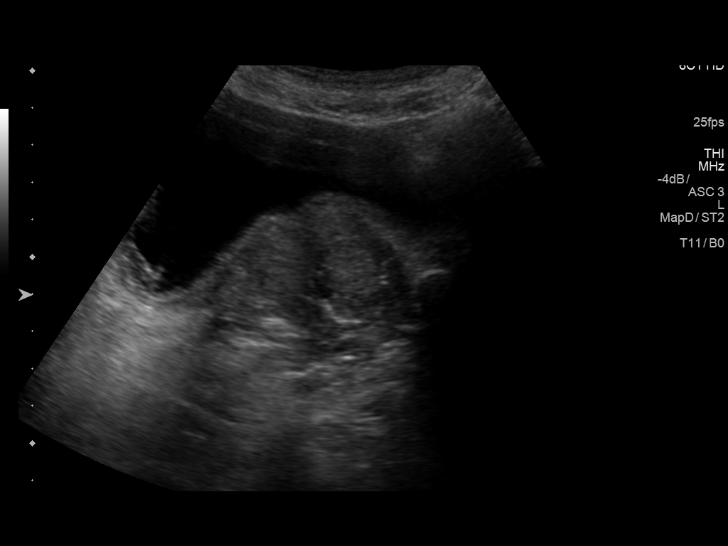
[im 10/14]
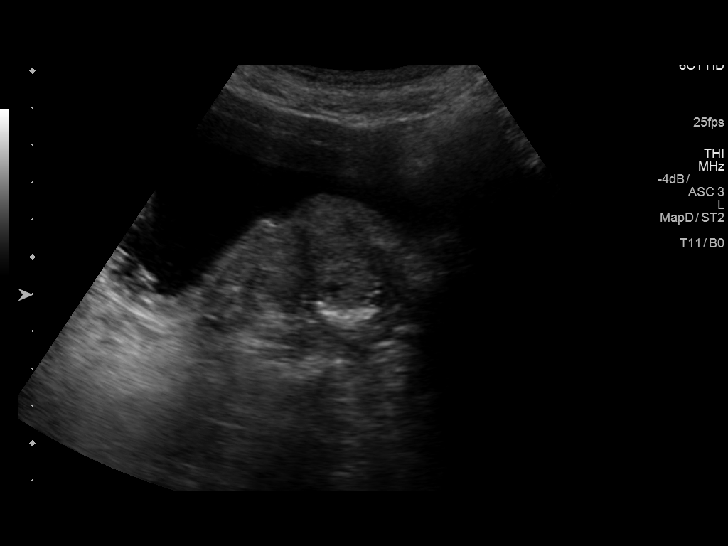
[im 11/14]
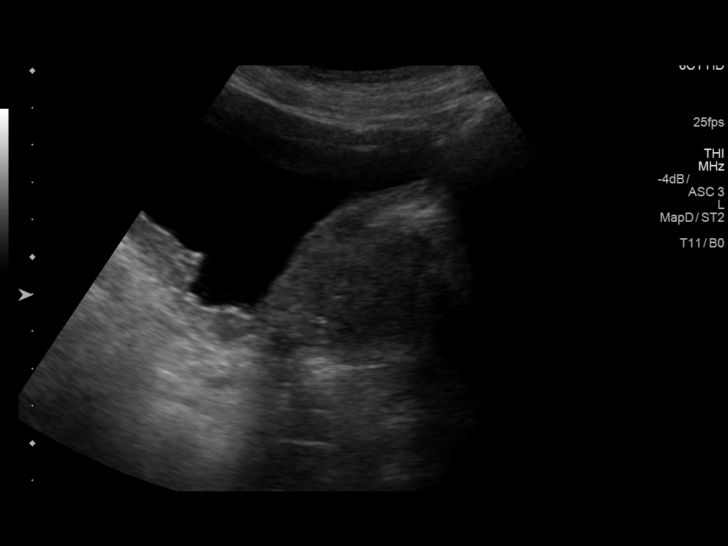
[im 12/14]
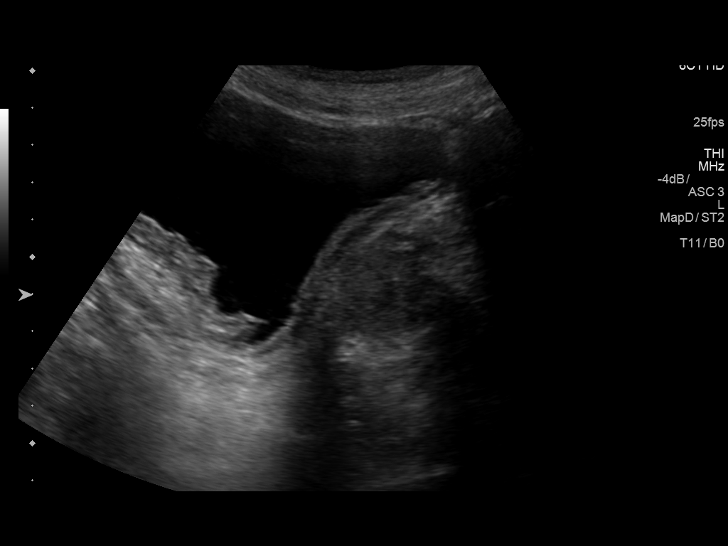
[im 13/14]
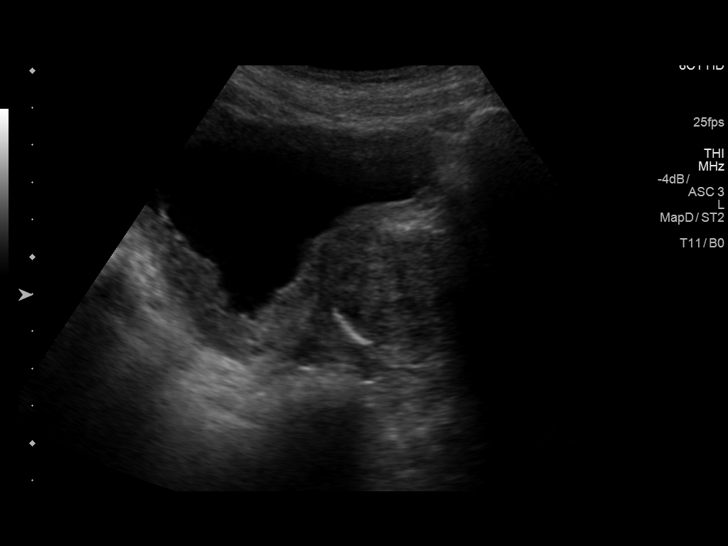
[im 14/14]
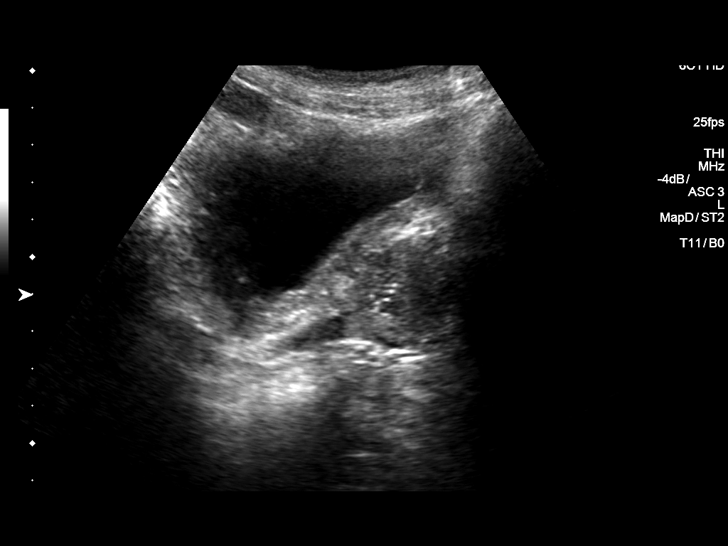

[14 of 14 positions shown; findings below may reference images not displayed]

FINDINGS: Fall bladder is nondistended. Bladder volume 182 cc. Bladder wall
thickening is present. This may be secondary to hypertrophy. Active
bladder pathology including cystitis cannot be excluded. The
prostate is prominent at 5.4 x 5.5 x 5.3 cm. This is consistent
patient's history of BPH.
IMPRESSION: 1. Mild bladder wall thickening. A bladder pathology including
cystitis cannot be excluded. This may be secondary to bladder
hypertrophy. No bladder distention . Bladder volume 182 cc.

2. Prostate enlargement consistent with the patient's history of
BPH.

## 2021-10-03 ENCOUNTER — Ambulatory Visit: Payer: Self-pay | Admitting: Physician Assistant

## 2021-10-26 ENCOUNTER — Ambulatory Visit: Payer: Self-pay | Admitting: Medical-Surgical

## 2021-10-27 NOTE — Patient Instructions (Signed)

## 2021-10-30 ENCOUNTER — Ambulatory Visit (INDEPENDENT_AMBULATORY_CARE_PROVIDER_SITE_OTHER): Payer: Medicare Other | Admitting: Physician Assistant

## 2021-10-30 ENCOUNTER — Encounter: Payer: Self-pay | Admitting: Physician Assistant

## 2021-10-30 VITALS — BP 118/75 | HR 72 | Temp 97.7°F | Ht 68.0 in | Wt 115.0 lb

## 2021-10-30 DIAGNOSIS — F209 Schizophrenia, unspecified: Secondary | ICD-10-CM | POA: Diagnosis not present

## 2021-10-30 DIAGNOSIS — Z7689 Persons encountering health services in other specified circumstances: Secondary | ICD-10-CM

## 2021-10-30 DIAGNOSIS — F339 Major depressive disorder, recurrent, unspecified: Secondary | ICD-10-CM

## 2021-10-30 NOTE — Progress Notes (Signed)
New Patient Office Visit  Subjective    Patient ID: Grant Bullock, male    DOB: January 16, 1956  Age: 66 y.o. MRN: [id number]  CC:  Chief Complaint  Patient presents with   New Patient (Initial Visit)    HPI Grant Bullock presents to establish care. Patient is only here because of insurance requirements in order to get his food. Reports does not take prescribed medications. Reports has seen various psychiatrists. States last one he saw was a PA at [place] 2-3 years ago. States was being treated for major depression, states has hx of psychosis. Also reports being dx with schizophrenia. Currently not on medication therapy. Trying holistic approach.  States prior PCP was Lavinia Sharps, transferred care because "he aged out of the system." Was told he was dying from metastatic prostate cancer but no formal dx. Patient states he has been cured. Pt denies prior hx of high blood pressure, high cholesterol, diabetes or heart disease.   No outpatient encounter medications on file as of 10/30/2021.   No facility-administered encounter medications on file as of 10/30/2021.    History reviewed. No pertinent past medical history.  History reviewed. No pertinent surgical history.  History reviewed. No pertinent family history.  Social History   Socioeconomic History   Marital status: Single    Spouse name: Not on file   Number of children: Not on file   Years of education: Not on file   Highest education level: Not on file  Occupational History   Not on file  Tobacco Use   Smoking status: Never   Smokeless tobacco: Never  Substance and Sexual Activity   Alcohol use: Not Currently   Drug use: Not Currently   Sexual activity: Not Currently  Other Topics Concern   Not on file  Social History Narrative   Not on file   Social Determinants of Health   Financial Resource Strain: Not on file  Food Insecurity: Not on file  Transportation Needs: Not on file  Physical Activity: Not on file   Stress: Not on file  Social Connections: Not on file  Intimate Partner Violence: Not on file    ROS Review of Systems:  A fourteen system review of systems was performed and found to be positive as per HPI.   Objective    BP 118/75   Pulse 72   Temp 97.7 F (36.5 C)   Ht 5\' 8"  (1.727 m)   Wt 115 lb (52.2 kg)   SpO2 100%   BMI 17.49 kg/m   Physical Exam General:  Well Developed, well nourished, appropriate for stated age.  Neuro:  Alert and oriented,  extra-ocular muscles intact  HEENT:  Normocephalic, atraumatic, neck supple  Skin:  no gross rash, warm, pink. Cardiac:  RRR, S1 S2 Respiratory: CTA B/L  Vascular:  Ext warm, no cyanosis apprec.; cap RF less 2 sec. Psych:  No HI/SI, judgement and insight poor.   Assessment & Plan:   Problem List Items Addressed This Visit   None Visit Diagnoses     Encounter to establish care    -  Primary   Schizophrenia, unspecified type (HCC)       Recurrent major depressive disorder, remission status unspecified (HCC)          Encounter to establish care: -On exam patient is exhibiting signs of grandiosity and also tangential thoughts, also carrying DNR sign around his chest so has s/sx concerning for active schizophrenia or underlying bipolar disorder. Will request records  from [place] and previous PCP. Patient deferred to follow-up for annual wellness and routine fasting labs.   Return if symptoms worsen or fail to improve.   Mayer Masker, PA-C

## 2022-09-20 ENCOUNTER — Encounter: Payer: Self-pay | Admitting: Family

## 2022-09-20 ENCOUNTER — Ambulatory Visit (INDEPENDENT_AMBULATORY_CARE_PROVIDER_SITE_OTHER): Payer: 59 | Admitting: Family

## 2022-09-20 VITALS — BP 114/62 | HR 82 | Resp 18 | Ht 68.0 in | Wt 122.0 lb

## 2022-09-20 DIAGNOSIS — F209 Schizophrenia, unspecified: Secondary | ICD-10-CM | POA: Insufficient documentation

## 2022-09-20 NOTE — Progress Notes (Signed)
Grant Bullock is a 67 y.o. male with the following history as recorded in EpicCare:  Patient Active Problem List   Diagnosis Date Noted   Schizophrenia, unspecified type 09/20/2022   BPH (benign prostatic hyperplasia) 02/24/2014   Asthmatic bronchitis 02/24/2014   Depression 02/24/2014   Nasal congestion 02/24/2014   Right inguinal hernia 02/24/2014    No current outpatient medications on file.   No current facility-administered medications for this visit.    Allergies: Horse-derived products, Penicillins, and Shellfish allergy  Past Medical History:  Diagnosis Date   Asthma    BPH (benign prostatic hyperplasia)    Depression    Hyperlipemia    Psychosis    Schizophrenia    Traumatic brain injury    Varicose vein     Past Surgical History:  Procedure Laterality Date   NASAL SINUS SURGERY     TONSILLECTOMY      No family history on file.  Social History   Tobacco Use   Smoking status: Never   Smokeless tobacco: Never  Substance Use Topics   Alcohol use: Not Currently    Subjective:   Presents today as a new patient; does not want to do any type of labs today; is wearing DNR badge around his neck in office today;  Needs to be established with PCP in order to get his food benefits updated;  He defers updating any type of blood work or getting his vaccines updated;  Notes he is only establishing care in order to get his food benefits continued.  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  Head: Normocephalic and atraumatic  Eyes: Sclera and conjunctiva clear; pupils round and reactive to light; extraocular movements intact  Ears: External normal; canals clear; tympanic membranes normal  Oropharynx: Pink, supple. No suspicious lesions  Neck: Supple without thyromegaly, adenopathy  Lungs: Respirations unlabored; clear to auscultation bilaterally without wheeze, rales, rhonchi  CVS exam: normal rate and regular rhythm.  Abdomen: Soft; nontender; nondistended; normoactive bowel sounds; no masses or hepatosplenomegaly  Musculoskeletal: No deformities; no active joint inflammation  Extremities: No edema, cyanosis, clubbing  Vessels: Symmetric bilaterally  Neurologic: Alert and oriented; speech intact; face symmetrical; moves all extremities well; CNII-XII intact without focal deficit   Assessment:  1. Schizophrenia, unspecified type     Plan:  Patient exhibits paranoid behavior/ delusions of grandeur in the office but is not suicidal or threatening other people. He is adamantly opposed to any type of lab or procedures or  vaccines being done.  He notes he needed our office as a contact for his food resources and is adamant that he does not need or want any other type of help at this time.   No follow-ups on file.  No orders of the defined types were placed in this encounter.   Requested Prescriptions    No prescriptions requested or ordered in this encounter

## 2023-01-03 DIAGNOSIS — E43 Unspecified severe protein-calorie malnutrition: Secondary | ICD-10-CM | POA: Diagnosis not present

## 2023-01-03 DIAGNOSIS — R6889 Other general symptoms and signs: Secondary | ICD-10-CM | POA: Diagnosis not present

## 2023-01-03 DIAGNOSIS — R579 Shock, unspecified: Secondary | ICD-10-CM | POA: Diagnosis not present

## 2023-01-03 DIAGNOSIS — M6282 Rhabdomyolysis: Secondary | ICD-10-CM | POA: Diagnosis not present

## 2023-01-03 DIAGNOSIS — N10 Acute tubulo-interstitial nephritis: Secondary | ICD-10-CM | POA: Diagnosis not present

## 2023-01-03 DIAGNOSIS — N138 Other obstructive and reflux uropathy: Secondary | ICD-10-CM | POA: Diagnosis not present

## 2023-01-03 DIAGNOSIS — R0689 Other abnormalities of breathing: Secondary | ICD-10-CM | POA: Diagnosis not present

## 2023-01-03 DIAGNOSIS — I4581 Long QT syndrome: Secondary | ICD-10-CM | POA: Diagnosis not present

## 2023-01-03 DIAGNOSIS — J690 Pneumonitis due to inhalation of food and vomit: Secondary | ICD-10-CM | POA: Diagnosis not present

## 2023-01-03 DIAGNOSIS — B9689 Other specified bacterial agents as the cause of diseases classified elsewhere: Secondary | ICD-10-CM | POA: Diagnosis not present

## 2023-01-03 DIAGNOSIS — D638 Anemia in other chronic diseases classified elsewhere: Secondary | ICD-10-CM | POA: Diagnosis not present

## 2023-01-03 DIAGNOSIS — J440 Chronic obstructive pulmonary disease with acute lower respiratory infection: Secondary | ICD-10-CM | POA: Diagnosis not present

## 2023-01-03 DIAGNOSIS — K5900 Constipation, unspecified: Secondary | ICD-10-CM | POA: Diagnosis not present

## 2023-01-03 DIAGNOSIS — K4090 Unilateral inguinal hernia, without obstruction or gangrene, not specified as recurrent: Secondary | ICD-10-CM | POA: Diagnosis not present

## 2023-01-03 DIAGNOSIS — A419 Sepsis, unspecified organism: Secondary | ICD-10-CM | POA: Diagnosis not present

## 2023-01-03 DIAGNOSIS — L89322 Pressure ulcer of left buttock, stage 2: Secondary | ICD-10-CM | POA: Diagnosis not present

## 2023-01-03 DIAGNOSIS — R338 Other retention of urine: Secondary | ICD-10-CM | POA: Diagnosis not present

## 2023-01-03 DIAGNOSIS — K449 Diaphragmatic hernia without obstruction or gangrene: Secondary | ICD-10-CM | POA: Diagnosis not present

## 2023-01-03 DIAGNOSIS — D649 Anemia, unspecified: Secondary | ICD-10-CM | POA: Diagnosis not present

## 2023-01-03 DIAGNOSIS — N179 Acute kidney failure, unspecified: Secondary | ICD-10-CM | POA: Diagnosis not present

## 2023-01-03 DIAGNOSIS — R0600 Dyspnea, unspecified: Secondary | ICD-10-CM | POA: Diagnosis not present

## 2023-01-03 DIAGNOSIS — R7881 Bacteremia: Secondary | ICD-10-CM | POA: Diagnosis not present

## 2023-01-03 DIAGNOSIS — I6523 Occlusion and stenosis of bilateral carotid arteries: Secondary | ICD-10-CM | POA: Diagnosis not present

## 2023-01-03 DIAGNOSIS — Z4682 Encounter for fitting and adjustment of non-vascular catheter: Secondary | ICD-10-CM | POA: Diagnosis not present

## 2023-01-03 DIAGNOSIS — G9341 Metabolic encephalopathy: Secondary | ICD-10-CM | POA: Diagnosis not present

## 2023-01-03 DIAGNOSIS — F32A Depression, unspecified: Secondary | ICD-10-CM | POA: Diagnosis not present

## 2023-01-03 DIAGNOSIS — N134 Hydroureter: Secondary | ICD-10-CM | POA: Diagnosis not present

## 2023-01-03 DIAGNOSIS — E870 Hyperosmolality and hypernatremia: Secondary | ICD-10-CM | POA: Diagnosis not present

## 2023-01-03 DIAGNOSIS — J9811 Atelectasis: Secondary | ICD-10-CM | POA: Diagnosis not present

## 2023-01-03 DIAGNOSIS — Z743 Need for continuous supervision: Secondary | ICD-10-CM | POA: Diagnosis not present

## 2023-01-03 DIAGNOSIS — R6521 Severe sepsis with septic shock: Secondary | ICD-10-CM | POA: Diagnosis not present

## 2023-01-03 DIAGNOSIS — N1330 Unspecified hydronephrosis: Secondary | ICD-10-CM | POA: Diagnosis not present

## 2023-01-03 DIAGNOSIS — A4189 Other specified sepsis: Secondary | ICD-10-CM | POA: Diagnosis not present

## 2023-01-03 DIAGNOSIS — Z681 Body mass index (BMI) 19 or less, adult: Secondary | ICD-10-CM | POA: Diagnosis not present

## 2023-01-03 DIAGNOSIS — N390 Urinary tract infection, site not specified: Secondary | ICD-10-CM | POA: Diagnosis not present

## 2023-01-03 DIAGNOSIS — I700 Atherosclerosis of aorta: Secondary | ICD-10-CM | POA: Diagnosis not present

## 2023-01-03 DIAGNOSIS — N139 Obstructive and reflux uropathy, unspecified: Secondary | ICD-10-CM | POA: Diagnosis not present

## 2023-01-03 DIAGNOSIS — I6782 Cerebral ischemia: Secondary | ICD-10-CM | POA: Diagnosis not present

## 2023-01-03 DIAGNOSIS — R000 Tachycardia, unspecified: Secondary | ICD-10-CM | POA: Diagnosis not present

## 2023-01-03 DIAGNOSIS — Z66 Do not resuscitate: Secondary | ICD-10-CM | POA: Diagnosis not present

## 2023-01-03 DIAGNOSIS — R1312 Dysphagia, oropharyngeal phase: Secondary | ICD-10-CM | POA: Diagnosis not present

## 2023-01-03 DIAGNOSIS — N136 Pyonephrosis: Secondary | ICD-10-CM | POA: Diagnosis not present

## 2023-01-03 DIAGNOSIS — A4159 Other Gram-negative sepsis: Secondary | ICD-10-CM | POA: Diagnosis not present

## 2023-01-03 DIAGNOSIS — L89896 Pressure-induced deep tissue damage of other site: Secondary | ICD-10-CM | POA: Diagnosis not present

## 2023-01-03 DIAGNOSIS — E876 Hypokalemia: Secondary | ICD-10-CM | POA: Diagnosis not present

## 2023-01-03 DIAGNOSIS — R188 Other ascites: Secondary | ICD-10-CM | POA: Diagnosis not present

## 2023-01-03 DIAGNOSIS — M6281 Muscle weakness (generalized): Secondary | ICD-10-CM | POA: Diagnosis not present

## 2023-01-03 DIAGNOSIS — N130 Hydronephrosis with ureteropelvic junction obstruction: Secondary | ICD-10-CM | POA: Diagnosis not present

## 2023-01-03 DIAGNOSIS — J45998 Other asthma: Secondary | ICD-10-CM | POA: Diagnosis not present

## 2023-01-03 DIAGNOSIS — I499 Cardiac arrhythmia, unspecified: Secondary | ICD-10-CM | POA: Diagnosis not present

## 2023-01-03 DIAGNOSIS — K838 Other specified diseases of biliary tract: Secondary | ICD-10-CM | POA: Diagnosis not present

## 2023-01-03 DIAGNOSIS — R410 Disorientation, unspecified: Secondary | ICD-10-CM | POA: Diagnosis not present

## 2023-01-03 DIAGNOSIS — N1339 Other hydronephrosis: Secondary | ICD-10-CM | POA: Diagnosis not present

## 2023-01-03 DIAGNOSIS — R339 Retention of urine, unspecified: Secondary | ICD-10-CM | POA: Diagnosis not present

## 2023-01-03 DIAGNOSIS — L89150 Pressure ulcer of sacral region, unstageable: Secondary | ICD-10-CM | POA: Diagnosis not present

## 2023-01-09 NOTE — Progress Notes (Signed)
Pt didn't answer phone for AWV. This encounter was created in error - please disregard.

## 2023-01-10 DIAGNOSIS — M6281 Muscle weakness (generalized): Secondary | ICD-10-CM | POA: Diagnosis not present

## 2023-01-10 DIAGNOSIS — R7989 Other specified abnormal findings of blood chemistry: Secondary | ICD-10-CM | POA: Diagnosis not present

## 2023-01-10 DIAGNOSIS — E870 Hyperosmolality and hypernatremia: Secondary | ICD-10-CM | POA: Diagnosis not present

## 2023-01-10 DIAGNOSIS — E860 Dehydration: Secondary | ICD-10-CM | POA: Diagnosis not present

## 2023-01-10 DIAGNOSIS — R1312 Dysphagia, oropharyngeal phase: Secondary | ICD-10-CM | POA: Diagnosis not present

## 2023-01-10 DIAGNOSIS — L89896 Pressure-induced deep tissue damage of other site: Secondary | ICD-10-CM | POA: Diagnosis not present

## 2023-01-10 DIAGNOSIS — R531 Weakness: Secondary | ICD-10-CM | POA: Diagnosis not present

## 2023-01-10 DIAGNOSIS — J45998 Other asthma: Secondary | ICD-10-CM | POA: Diagnosis not present

## 2023-01-10 DIAGNOSIS — Z515 Encounter for palliative care: Secondary | ICD-10-CM | POA: Diagnosis not present

## 2023-01-10 DIAGNOSIS — N181 Chronic kidney disease, stage 1: Secondary | ICD-10-CM | POA: Diagnosis not present

## 2023-01-10 DIAGNOSIS — K4090 Unilateral inguinal hernia, without obstruction or gangrene, not specified as recurrent: Secondary | ICD-10-CM | POA: Diagnosis not present

## 2023-01-10 DIAGNOSIS — K219 Gastro-esophageal reflux disease without esophagitis: Secondary | ICD-10-CM | POA: Diagnosis not present

## 2023-01-10 DIAGNOSIS — N179 Acute kidney failure, unspecified: Secondary | ICD-10-CM | POA: Diagnosis not present

## 2023-01-10 DIAGNOSIS — E43 Unspecified severe protein-calorie malnutrition: Secondary | ICD-10-CM | POA: Diagnosis not present

## 2023-01-10 DIAGNOSIS — I6782 Cerebral ischemia: Secondary | ICD-10-CM | POA: Diagnosis not present

## 2023-01-10 DIAGNOSIS — D649 Anemia, unspecified: Secondary | ICD-10-CM | POA: Diagnosis not present

## 2023-01-10 DIAGNOSIS — I700 Atherosclerosis of aorta: Secondary | ICD-10-CM | POA: Diagnosis not present

## 2023-01-10 DIAGNOSIS — R410 Disorientation, unspecified: Secondary | ICD-10-CM | POA: Diagnosis not present

## 2023-01-10 DIAGNOSIS — Z20822 Contact with and (suspected) exposure to covid-19: Secondary | ICD-10-CM | POA: Diagnosis not present

## 2023-01-10 DIAGNOSIS — N10 Acute tubulo-interstitial nephritis: Secondary | ICD-10-CM | POA: Diagnosis not present

## 2023-01-10 DIAGNOSIS — R188 Other ascites: Secondary | ICD-10-CM | POA: Diagnosis not present

## 2023-01-10 DIAGNOSIS — N130 Hydronephrosis with ureteropelvic junction obstruction: Secondary | ICD-10-CM | POA: Diagnosis not present

## 2023-01-10 DIAGNOSIS — F32A Depression, unspecified: Secondary | ICD-10-CM | POA: Diagnosis not present

## 2023-01-10 DIAGNOSIS — I959 Hypotension, unspecified: Secondary | ICD-10-CM | POA: Diagnosis not present

## 2023-01-10 DIAGNOSIS — E876 Hypokalemia: Secondary | ICD-10-CM | POA: Diagnosis not present

## 2023-01-10 DIAGNOSIS — N138 Other obstructive and reflux uropathy: Secondary | ICD-10-CM | POA: Diagnosis not present

## 2023-01-10 DIAGNOSIS — R338 Other retention of urine: Secondary | ICD-10-CM | POA: Diagnosis not present

## 2023-01-10 DIAGNOSIS — K449 Diaphragmatic hernia without obstruction or gangrene: Secondary | ICD-10-CM | POA: Diagnosis not present

## 2023-01-10 DIAGNOSIS — L89890 Pressure ulcer of other site, unstageable: Secondary | ICD-10-CM | POA: Diagnosis not present

## 2023-01-10 DIAGNOSIS — R52 Pain, unspecified: Secondary | ICD-10-CM | POA: Diagnosis not present

## 2023-01-10 DIAGNOSIS — Z978 Presence of other specified devices: Secondary | ICD-10-CM | POA: Diagnosis not present

## 2023-01-10 DIAGNOSIS — J9811 Atelectasis: Secondary | ICD-10-CM | POA: Diagnosis not present

## 2023-01-10 DIAGNOSIS — R29898 Other symptoms and signs involving the musculoskeletal system: Secondary | ICD-10-CM | POA: Diagnosis not present

## 2023-01-10 DIAGNOSIS — R1310 Dysphagia, unspecified: Secondary | ICD-10-CM | POA: Diagnosis not present

## 2023-01-10 DIAGNOSIS — L89150 Pressure ulcer of sacral region, unstageable: Secondary | ICD-10-CM | POA: Diagnosis not present

## 2023-01-10 DIAGNOSIS — N390 Urinary tract infection, site not specified: Secondary | ICD-10-CM | POA: Diagnosis not present

## 2023-01-10 DIAGNOSIS — D638 Anemia in other chronic diseases classified elsewhere: Secondary | ICD-10-CM | POA: Diagnosis not present

## 2023-01-10 DIAGNOSIS — R7881 Bacteremia: Secondary | ICD-10-CM | POA: Diagnosis not present

## 2023-01-10 DIAGNOSIS — R339 Retention of urine, unspecified: Secondary | ICD-10-CM | POA: Diagnosis not present

## 2023-01-10 DIAGNOSIS — W19XXXA Unspecified fall, initial encounter: Secondary | ICD-10-CM | POA: Diagnosis not present

## 2023-01-11 DIAGNOSIS — Z978 Presence of other specified devices: Secondary | ICD-10-CM | POA: Diagnosis not present

## 2023-01-11 DIAGNOSIS — R29898 Other symptoms and signs involving the musculoskeletal system: Secondary | ICD-10-CM | POA: Diagnosis not present

## 2023-01-12 DIAGNOSIS — R531 Weakness: Secondary | ICD-10-CM | POA: Diagnosis not present

## 2023-01-12 DIAGNOSIS — R29898 Other symptoms and signs involving the musculoskeletal system: Secondary | ICD-10-CM | POA: Diagnosis not present

## 2023-01-14 DIAGNOSIS — L89896 Pressure-induced deep tissue damage of other site: Secondary | ICD-10-CM | POA: Diagnosis not present

## 2023-01-14 DIAGNOSIS — L89150 Pressure ulcer of sacral region, unstageable: Secondary | ICD-10-CM | POA: Diagnosis not present

## 2023-01-14 DIAGNOSIS — E43 Unspecified severe protein-calorie malnutrition: Secondary | ICD-10-CM | POA: Diagnosis not present

## 2023-01-15 DIAGNOSIS — R410 Disorientation, unspecified: Secondary | ICD-10-CM | POA: Diagnosis not present

## 2023-01-15 DIAGNOSIS — R52 Pain, unspecified: Secondary | ICD-10-CM | POA: Diagnosis not present

## 2023-01-15 DIAGNOSIS — F32A Depression, unspecified: Secondary | ICD-10-CM | POA: Diagnosis not present

## 2023-01-15 DIAGNOSIS — R29898 Other symptoms and signs involving the musculoskeletal system: Secondary | ICD-10-CM | POA: Diagnosis not present

## 2023-01-16 DIAGNOSIS — R29898 Other symptoms and signs involving the musculoskeletal system: Secondary | ICD-10-CM | POA: Diagnosis not present

## 2023-01-16 DIAGNOSIS — R1310 Dysphagia, unspecified: Secondary | ICD-10-CM | POA: Diagnosis not present

## 2023-01-16 DIAGNOSIS — E43 Unspecified severe protein-calorie malnutrition: Secondary | ICD-10-CM | POA: Diagnosis not present

## 2023-01-17 DIAGNOSIS — R52 Pain, unspecified: Secondary | ICD-10-CM | POA: Diagnosis not present

## 2023-01-17 DIAGNOSIS — Z978 Presence of other specified devices: Secondary | ICD-10-CM | POA: Diagnosis not present

## 2023-01-17 DIAGNOSIS — N390 Urinary tract infection, site not specified: Secondary | ICD-10-CM | POA: Diagnosis not present

## 2023-01-17 DIAGNOSIS — W19XXXA Unspecified fall, initial encounter: Secondary | ICD-10-CM | POA: Diagnosis not present

## 2023-01-17 DIAGNOSIS — R29898 Other symptoms and signs involving the musculoskeletal system: Secondary | ICD-10-CM | POA: Diagnosis not present

## 2023-01-18 ENCOUNTER — Other Ambulatory Visit: Payer: Self-pay | Admitting: *Deleted

## 2023-01-18 NOTE — Patient Outreach (Signed)
Mr. Deruiter resides in [facility] skilled nursing facility. Screening for potential care coordination services as benefit of health plan and Primary Care Provider.   Collaboration with Dipam, therapy manager this week to make aware writer is following for potential care coordination needs.  Will continue to follow.   Raiford Noble, MSN, RN,BSN [facility] Post Acute Care Coordinator [phone] (Direct dial)

## 2023-01-19 DIAGNOSIS — D649 Anemia, unspecified: Secondary | ICD-10-CM | POA: Diagnosis not present

## 2023-01-19 DIAGNOSIS — R7989 Other specified abnormal findings of blood chemistry: Secondary | ICD-10-CM | POA: Diagnosis not present

## 2023-01-19 DIAGNOSIS — N181 Chronic kidney disease, stage 1: Secondary | ICD-10-CM | POA: Diagnosis not present

## 2023-01-19 DIAGNOSIS — R531 Weakness: Secondary | ICD-10-CM | POA: Diagnosis not present

## 2023-01-20 DIAGNOSIS — E860 Dehydration: Secondary | ICD-10-CM | POA: Diagnosis not present

## 2023-01-20 DIAGNOSIS — I959 Hypotension, unspecified: Secondary | ICD-10-CM | POA: Diagnosis not present

## 2023-01-20 DIAGNOSIS — R1310 Dysphagia, unspecified: Secondary | ICD-10-CM | POA: Diagnosis not present

## 2023-01-20 DIAGNOSIS — R531 Weakness: Secondary | ICD-10-CM | POA: Diagnosis not present

## 2023-01-21 DIAGNOSIS — E43 Unspecified severe protein-calorie malnutrition: Secondary | ICD-10-CM | POA: Diagnosis not present

## 2023-01-21 DIAGNOSIS — E860 Dehydration: Secondary | ICD-10-CM | POA: Diagnosis not present

## 2023-01-21 DIAGNOSIS — L89890 Pressure ulcer of other site, unstageable: Secondary | ICD-10-CM | POA: Diagnosis not present

## 2023-01-21 DIAGNOSIS — R531 Weakness: Secondary | ICD-10-CM | POA: Diagnosis not present

## 2023-01-21 DIAGNOSIS — L89150 Pressure ulcer of sacral region, unstageable: Secondary | ICD-10-CM | POA: Diagnosis not present

## 2023-01-21 DIAGNOSIS — R1310 Dysphagia, unspecified: Secondary | ICD-10-CM | POA: Diagnosis not present

## 2023-01-21 DIAGNOSIS — I959 Hypotension, unspecified: Secondary | ICD-10-CM | POA: Diagnosis not present

## 2023-01-23 DIAGNOSIS — R29898 Other symptoms and signs involving the musculoskeletal system: Secondary | ICD-10-CM | POA: Diagnosis not present

## 2023-01-27 DIAGNOSIS — E43 Unspecified severe protein-calorie malnutrition: Secondary | ICD-10-CM | POA: Diagnosis not present

## 2023-01-27 DIAGNOSIS — R1310 Dysphagia, unspecified: Secondary | ICD-10-CM | POA: Diagnosis not present

## 2023-01-27 DIAGNOSIS — R531 Weakness: Secondary | ICD-10-CM | POA: Diagnosis not present

## 2023-01-28 DIAGNOSIS — L89890 Pressure ulcer of other site, unstageable: Secondary | ICD-10-CM | POA: Diagnosis not present

## 2023-01-28 DIAGNOSIS — L89150 Pressure ulcer of sacral region, unstageable: Secondary | ICD-10-CM | POA: Diagnosis not present

## 2023-01-28 DIAGNOSIS — I959 Hypotension, unspecified: Secondary | ICD-10-CM | POA: Diagnosis not present

## 2023-01-28 DIAGNOSIS — E43 Unspecified severe protein-calorie malnutrition: Secondary | ICD-10-CM | POA: Diagnosis not present

## 2023-01-30 DIAGNOSIS — Z515 Encounter for palliative care: Secondary | ICD-10-CM | POA: Diagnosis not present

## 2023-01-30 DIAGNOSIS — R1310 Dysphagia, unspecified: Secondary | ICD-10-CM | POA: Diagnosis not present

## 2023-01-30 DIAGNOSIS — I959 Hypotension, unspecified: Secondary | ICD-10-CM | POA: Diagnosis not present

## 2023-01-30 DIAGNOSIS — Z978 Presence of other specified devices: Secondary | ICD-10-CM | POA: Diagnosis not present

## 2023-01-30 DIAGNOSIS — N138 Other obstructive and reflux uropathy: Secondary | ICD-10-CM | POA: Diagnosis not present

## 2023-01-30 DIAGNOSIS — E43 Unspecified severe protein-calorie malnutrition: Secondary | ICD-10-CM | POA: Diagnosis not present

## 2023-01-31 DIAGNOSIS — R531 Weakness: Secondary | ICD-10-CM | POA: Diagnosis not present

## 2023-01-31 DIAGNOSIS — I959 Hypotension, unspecified: Secondary | ICD-10-CM | POA: Diagnosis not present

## 2023-01-31 DIAGNOSIS — Z20822 Contact with and (suspected) exposure to covid-19: Secondary | ICD-10-CM | POA: Diagnosis not present

## 2023-01-31 DIAGNOSIS — R1310 Dysphagia, unspecified: Secondary | ICD-10-CM | POA: Diagnosis not present

## 2023-02-02 DIAGNOSIS — Z20822 Contact with and (suspected) exposure to covid-19: Secondary | ICD-10-CM | POA: Diagnosis not present

## 2023-02-02 DIAGNOSIS — R531 Weakness: Secondary | ICD-10-CM | POA: Diagnosis not present

## 2023-02-02 DIAGNOSIS — E43 Unspecified severe protein-calorie malnutrition: Secondary | ICD-10-CM | POA: Diagnosis not present

## 2023-02-04 DIAGNOSIS — E43 Unspecified severe protein-calorie malnutrition: Secondary | ICD-10-CM | POA: Diagnosis not present

## 2023-02-04 DIAGNOSIS — L89890 Pressure ulcer of other site, unstageable: Secondary | ICD-10-CM | POA: Diagnosis not present

## 2023-02-04 DIAGNOSIS — L89150 Pressure ulcer of sacral region, unstageable: Secondary | ICD-10-CM | POA: Diagnosis not present

## 2023-02-05 DIAGNOSIS — Z20822 Contact with and (suspected) exposure to covid-19: Secondary | ICD-10-CM | POA: Diagnosis not present

## 2023-02-06 DIAGNOSIS — Z20822 Contact with and (suspected) exposure to covid-19: Secondary | ICD-10-CM | POA: Diagnosis not present

## 2023-02-06 DIAGNOSIS — K219 Gastro-esophageal reflux disease without esophagitis: Secondary | ICD-10-CM | POA: Diagnosis not present

## 2023-02-07 DIAGNOSIS — Z20822 Contact with and (suspected) exposure to covid-19: Secondary | ICD-10-CM | POA: Diagnosis not present

## 2023-02-07 DIAGNOSIS — Z978 Presence of other specified devices: Secondary | ICD-10-CM | POA: Diagnosis not present

## 2023-02-07 DIAGNOSIS — R339 Retention of urine, unspecified: Secondary | ICD-10-CM | POA: Diagnosis not present

## 2023-02-08 DIAGNOSIS — R339 Retention of urine, unspecified: Secondary | ICD-10-CM | POA: Diagnosis not present

## 2023-02-08 DIAGNOSIS — Z20822 Contact with and (suspected) exposure to covid-19: Secondary | ICD-10-CM | POA: Diagnosis not present

## 2023-02-08 DIAGNOSIS — Z978 Presence of other specified devices: Secondary | ICD-10-CM | POA: Diagnosis not present

## 2023-02-10 DIAGNOSIS — E43 Unspecified severe protein-calorie malnutrition: Secondary | ICD-10-CM | POA: Diagnosis not present

## 2023-02-10 DIAGNOSIS — Z20822 Contact with and (suspected) exposure to covid-19: Secondary | ICD-10-CM | POA: Diagnosis not present

## 2023-02-10 DIAGNOSIS — R531 Weakness: Secondary | ICD-10-CM | POA: Diagnosis not present

## 2023-02-11 DIAGNOSIS — R531 Weakness: Secondary | ICD-10-CM | POA: Diagnosis not present

## 2023-02-11 DIAGNOSIS — E43 Unspecified severe protein-calorie malnutrition: Secondary | ICD-10-CM | POA: Diagnosis not present

## 2023-02-11 DIAGNOSIS — S31000D Unspecified open wound of lower back and pelvis without penetration into retroperitoneum, subsequent encounter: Secondary | ICD-10-CM | POA: Diagnosis not present

## 2023-02-11 DIAGNOSIS — R1312 Dysphagia, oropharyngeal phase: Secondary | ICD-10-CM | POA: Diagnosis not present

## 2023-02-11 DIAGNOSIS — N10 Acute tubulo-interstitial nephritis: Secondary | ICD-10-CM | POA: Diagnosis not present

## 2023-02-11 DIAGNOSIS — M6281 Muscle weakness (generalized): Secondary | ICD-10-CM | POA: Diagnosis not present

## 2023-02-12 DIAGNOSIS — R1312 Dysphagia, oropharyngeal phase: Secondary | ICD-10-CM | POA: Diagnosis not present

## 2023-02-12 DIAGNOSIS — M6281 Muscle weakness (generalized): Secondary | ICD-10-CM | POA: Diagnosis not present

## 2023-02-12 DIAGNOSIS — N10 Acute tubulo-interstitial nephritis: Secondary | ICD-10-CM | POA: Diagnosis not present

## 2023-02-13 DIAGNOSIS — R627 Adult failure to thrive: Secondary | ICD-10-CM | POA: Diagnosis not present

## 2023-02-13 DIAGNOSIS — E43 Unspecified severe protein-calorie malnutrition: Secondary | ICD-10-CM | POA: Diagnosis not present

## 2023-02-13 DIAGNOSIS — Z515 Encounter for palliative care: Secondary | ICD-10-CM | POA: Diagnosis not present

## 2023-02-13 DIAGNOSIS — L89150 Pressure ulcer of sacral region, unstageable: Secondary | ICD-10-CM | POA: Diagnosis not present

## 2023-02-13 DIAGNOSIS — N10 Acute tubulo-interstitial nephritis: Secondary | ICD-10-CM | POA: Diagnosis not present

## 2023-02-13 DIAGNOSIS — Z79899 Other long term (current) drug therapy: Secondary | ICD-10-CM | POA: Diagnosis not present

## 2023-02-13 DIAGNOSIS — R1312 Dysphagia, oropharyngeal phase: Secondary | ICD-10-CM | POA: Diagnosis not present

## 2023-02-13 DIAGNOSIS — R531 Weakness: Secondary | ICD-10-CM | POA: Diagnosis not present

## 2023-02-13 DIAGNOSIS — R1310 Dysphagia, unspecified: Secondary | ICD-10-CM | POA: Diagnosis not present

## 2023-02-13 DIAGNOSIS — L89890 Pressure ulcer of other site, unstageable: Secondary | ICD-10-CM | POA: Diagnosis not present

## 2023-02-13 DIAGNOSIS — M6281 Muscle weakness (generalized): Secondary | ICD-10-CM | POA: Diagnosis not present

## 2023-02-14 DIAGNOSIS — M6281 Muscle weakness (generalized): Secondary | ICD-10-CM | POA: Diagnosis not present

## 2023-02-14 DIAGNOSIS — R1312 Dysphagia, oropharyngeal phase: Secondary | ICD-10-CM | POA: Diagnosis not present

## 2023-02-14 DIAGNOSIS — N10 Acute tubulo-interstitial nephritis: Secondary | ICD-10-CM | POA: Diagnosis not present

## 2023-02-15 DIAGNOSIS — R531 Weakness: Secondary | ICD-10-CM | POA: Diagnosis not present

## 2023-02-15 DIAGNOSIS — N10 Acute tubulo-interstitial nephritis: Secondary | ICD-10-CM | POA: Diagnosis not present

## 2023-02-15 DIAGNOSIS — R1312 Dysphagia, oropharyngeal phase: Secondary | ICD-10-CM | POA: Diagnosis not present

## 2023-02-15 DIAGNOSIS — E43 Unspecified severe protein-calorie malnutrition: Secondary | ICD-10-CM | POA: Diagnosis not present

## 2023-02-15 DIAGNOSIS — M6281 Muscle weakness (generalized): Secondary | ICD-10-CM | POA: Diagnosis not present

## 2023-02-16 DIAGNOSIS — M6281 Muscle weakness (generalized): Secondary | ICD-10-CM | POA: Diagnosis not present

## 2023-02-16 DIAGNOSIS — R1312 Dysphagia, oropharyngeal phase: Secondary | ICD-10-CM | POA: Diagnosis not present

## 2023-02-16 DIAGNOSIS — N10 Acute tubulo-interstitial nephritis: Secondary | ICD-10-CM | POA: Diagnosis not present

## 2023-02-17 DIAGNOSIS — R531 Weakness: Secondary | ICD-10-CM | POA: Diagnosis not present

## 2023-02-17 DIAGNOSIS — N10 Acute tubulo-interstitial nephritis: Secondary | ICD-10-CM | POA: Diagnosis not present

## 2023-02-17 DIAGNOSIS — M6281 Muscle weakness (generalized): Secondary | ICD-10-CM | POA: Diagnosis not present

## 2023-02-17 DIAGNOSIS — E43 Unspecified severe protein-calorie malnutrition: Secondary | ICD-10-CM | POA: Diagnosis not present

## 2023-02-17 DIAGNOSIS — R627 Adult failure to thrive: Secondary | ICD-10-CM | POA: Diagnosis not present

## 2023-02-17 DIAGNOSIS — R1312 Dysphagia, oropharyngeal phase: Secondary | ICD-10-CM | POA: Diagnosis not present

## 2023-02-18 DIAGNOSIS — M6281 Muscle weakness (generalized): Secondary | ICD-10-CM | POA: Diagnosis not present

## 2023-02-18 DIAGNOSIS — E43 Unspecified severe protein-calorie malnutrition: Secondary | ICD-10-CM | POA: Diagnosis not present

## 2023-02-18 DIAGNOSIS — R1312 Dysphagia, oropharyngeal phase: Secondary | ICD-10-CM | POA: Diagnosis not present

## 2023-02-18 DIAGNOSIS — L89893 Pressure ulcer of other site, stage 3: Secondary | ICD-10-CM | POA: Diagnosis not present

## 2023-02-18 DIAGNOSIS — N10 Acute tubulo-interstitial nephritis: Secondary | ICD-10-CM | POA: Diagnosis not present

## 2023-02-18 DIAGNOSIS — L89150 Pressure ulcer of sacral region, unstageable: Secondary | ICD-10-CM | POA: Diagnosis not present

## 2023-02-19 DIAGNOSIS — N10 Acute tubulo-interstitial nephritis: Secondary | ICD-10-CM | POA: Diagnosis not present

## 2023-02-19 DIAGNOSIS — E43 Unspecified severe protein-calorie malnutrition: Secondary | ICD-10-CM | POA: Diagnosis not present

## 2023-02-19 DIAGNOSIS — R1312 Dysphagia, oropharyngeal phase: Secondary | ICD-10-CM | POA: Diagnosis not present

## 2023-02-19 DIAGNOSIS — M6281 Muscle weakness (generalized): Secondary | ICD-10-CM | POA: Diagnosis not present

## 2023-02-19 DIAGNOSIS — R627 Adult failure to thrive: Secondary | ICD-10-CM | POA: Diagnosis not present

## 2023-02-19 DIAGNOSIS — R29898 Other symptoms and signs involving the musculoskeletal system: Secondary | ICD-10-CM | POA: Diagnosis not present

## 2023-02-20 DIAGNOSIS — M6281 Muscle weakness (generalized): Secondary | ICD-10-CM | POA: Diagnosis not present

## 2023-02-20 DIAGNOSIS — R1312 Dysphagia, oropharyngeal phase: Secondary | ICD-10-CM | POA: Diagnosis not present

## 2023-02-20 DIAGNOSIS — N10 Acute tubulo-interstitial nephritis: Secondary | ICD-10-CM | POA: Diagnosis not present

## 2023-02-21 DIAGNOSIS — N10 Acute tubulo-interstitial nephritis: Secondary | ICD-10-CM | POA: Diagnosis not present

## 2023-02-21 DIAGNOSIS — M6281 Muscle weakness (generalized): Secondary | ICD-10-CM | POA: Diagnosis not present

## 2023-02-21 DIAGNOSIS — R1312 Dysphagia, oropharyngeal phase: Secondary | ICD-10-CM | POA: Diagnosis not present

## 2023-02-22 DIAGNOSIS — M6281 Muscle weakness (generalized): Secondary | ICD-10-CM | POA: Diagnosis not present

## 2023-02-22 DIAGNOSIS — R1312 Dysphagia, oropharyngeal phase: Secondary | ICD-10-CM | POA: Diagnosis not present

## 2023-02-22 DIAGNOSIS — N10 Acute tubulo-interstitial nephritis: Secondary | ICD-10-CM | POA: Diagnosis not present

## 2023-02-23 DIAGNOSIS — R531 Weakness: Secondary | ICD-10-CM | POA: Diagnosis not present

## 2023-02-23 DIAGNOSIS — E43 Unspecified severe protein-calorie malnutrition: Secondary | ICD-10-CM | POA: Diagnosis not present

## 2023-02-23 DIAGNOSIS — R29898 Other symptoms and signs involving the musculoskeletal system: Secondary | ICD-10-CM | POA: Diagnosis not present

## 2023-02-24 DIAGNOSIS — M6281 Muscle weakness (generalized): Secondary | ICD-10-CM | POA: Diagnosis not present

## 2023-02-24 DIAGNOSIS — R1312 Dysphagia, oropharyngeal phase: Secondary | ICD-10-CM | POA: Diagnosis not present

## 2023-02-24 DIAGNOSIS — N10 Acute tubulo-interstitial nephritis: Secondary | ICD-10-CM | POA: Diagnosis not present

## 2023-02-25 DIAGNOSIS — E43 Unspecified severe protein-calorie malnutrition: Secondary | ICD-10-CM | POA: Diagnosis not present

## 2023-02-25 DIAGNOSIS — N10 Acute tubulo-interstitial nephritis: Secondary | ICD-10-CM | POA: Diagnosis not present

## 2023-02-25 DIAGNOSIS — L89893 Pressure ulcer of other site, stage 3: Secondary | ICD-10-CM | POA: Diagnosis not present

## 2023-02-25 DIAGNOSIS — L89153 Pressure ulcer of sacral region, stage 3: Secondary | ICD-10-CM | POA: Diagnosis not present

## 2023-02-25 DIAGNOSIS — R1312 Dysphagia, oropharyngeal phase: Secondary | ICD-10-CM | POA: Diagnosis not present

## 2023-02-25 DIAGNOSIS — M6281 Muscle weakness (generalized): Secondary | ICD-10-CM | POA: Diagnosis not present

## 2023-02-26 DIAGNOSIS — R1312 Dysphagia, oropharyngeal phase: Secondary | ICD-10-CM | POA: Diagnosis not present

## 2023-02-26 DIAGNOSIS — N10 Acute tubulo-interstitial nephritis: Secondary | ICD-10-CM | POA: Diagnosis not present

## 2023-02-26 DIAGNOSIS — M6281 Muscle weakness (generalized): Secondary | ICD-10-CM | POA: Diagnosis not present

## 2023-02-27 ENCOUNTER — Other Ambulatory Visit: Payer: Self-pay | Admitting: *Deleted

## 2023-02-27 DIAGNOSIS — E43 Unspecified severe protein-calorie malnutrition: Secondary | ICD-10-CM | POA: Diagnosis not present

## 2023-02-27 DIAGNOSIS — R1312 Dysphagia, oropharyngeal phase: Secondary | ICD-10-CM | POA: Diagnosis not present

## 2023-02-27 DIAGNOSIS — N10 Acute tubulo-interstitial nephritis: Secondary | ICD-10-CM | POA: Diagnosis not present

## 2023-02-27 DIAGNOSIS — R1310 Dysphagia, unspecified: Secondary | ICD-10-CM | POA: Diagnosis not present

## 2023-02-27 DIAGNOSIS — M6281 Muscle weakness (generalized): Secondary | ICD-10-CM | POA: Diagnosis not present

## 2023-02-27 DIAGNOSIS — R531 Weakness: Secondary | ICD-10-CM | POA: Diagnosis not present

## 2023-02-27 NOTE — Patient Outreach (Signed)
Grant Bullock resides in [facility] skilled nursing facility. Screening for potential care coordination/chronic care management services as a benefit of health plan and primary care provider.  Previous update from [place] social worker, indicated transition plan is for long term care. They are in the process of looking into multiple facilities.   No identifiable care coordination needs.   Raiford Noble, MSN, RN, BSN [place]  [facility] RN Post- Acute Care Coordinator Direct Dial: [phone]

## 2023-02-28 DIAGNOSIS — M6281 Muscle weakness (generalized): Secondary | ICD-10-CM | POA: Diagnosis not present

## 2023-02-28 DIAGNOSIS — R1312 Dysphagia, oropharyngeal phase: Secondary | ICD-10-CM | POA: Diagnosis not present

## 2023-02-28 DIAGNOSIS — N10 Acute tubulo-interstitial nephritis: Secondary | ICD-10-CM | POA: Diagnosis not present

## 2023-03-01 DIAGNOSIS — R1312 Dysphagia, oropharyngeal phase: Secondary | ICD-10-CM | POA: Diagnosis not present

## 2023-03-01 DIAGNOSIS — M6281 Muscle weakness (generalized): Secondary | ICD-10-CM | POA: Diagnosis not present

## 2023-03-01 DIAGNOSIS — R531 Weakness: Secondary | ICD-10-CM | POA: Diagnosis not present

## 2023-03-01 DIAGNOSIS — N10 Acute tubulo-interstitial nephritis: Secondary | ICD-10-CM | POA: Diagnosis not present

## 2023-03-01 DIAGNOSIS — E43 Unspecified severe protein-calorie malnutrition: Secondary | ICD-10-CM | POA: Diagnosis not present

## 2023-03-04 DIAGNOSIS — L89153 Pressure ulcer of sacral region, stage 3: Secondary | ICD-10-CM | POA: Diagnosis not present

## 2023-03-04 DIAGNOSIS — N10 Acute tubulo-interstitial nephritis: Secondary | ICD-10-CM | POA: Diagnosis not present

## 2023-03-04 DIAGNOSIS — E43 Unspecified severe protein-calorie malnutrition: Secondary | ICD-10-CM | POA: Diagnosis not present

## 2023-03-04 DIAGNOSIS — M6281 Muscle weakness (generalized): Secondary | ICD-10-CM | POA: Diagnosis not present

## 2023-03-04 DIAGNOSIS — L89893 Pressure ulcer of other site, stage 3: Secondary | ICD-10-CM | POA: Diagnosis not present

## 2023-03-04 DIAGNOSIS — R1312 Dysphagia, oropharyngeal phase: Secondary | ICD-10-CM | POA: Diagnosis not present

## 2023-03-05 DIAGNOSIS — M6281 Muscle weakness (generalized): Secondary | ICD-10-CM | POA: Diagnosis not present

## 2023-03-05 DIAGNOSIS — N10 Acute tubulo-interstitial nephritis: Secondary | ICD-10-CM | POA: Diagnosis not present

## 2023-03-05 DIAGNOSIS — R1312 Dysphagia, oropharyngeal phase: Secondary | ICD-10-CM | POA: Diagnosis not present

## 2023-03-06 DIAGNOSIS — N10 Acute tubulo-interstitial nephritis: Secondary | ICD-10-CM | POA: Diagnosis not present

## 2023-03-06 DIAGNOSIS — Z515 Encounter for palliative care: Secondary | ICD-10-CM | POA: Diagnosis not present

## 2023-03-06 DIAGNOSIS — E43 Unspecified severe protein-calorie malnutrition: Secondary | ICD-10-CM | POA: Diagnosis not present

## 2023-03-06 DIAGNOSIS — R627 Adult failure to thrive: Secondary | ICD-10-CM | POA: Diagnosis not present

## 2023-03-06 DIAGNOSIS — M6281 Muscle weakness (generalized): Secondary | ICD-10-CM | POA: Diagnosis not present

## 2023-03-06 DIAGNOSIS — R1312 Dysphagia, oropharyngeal phase: Secondary | ICD-10-CM | POA: Diagnosis not present

## 2023-03-07 DIAGNOSIS — R1312 Dysphagia, oropharyngeal phase: Secondary | ICD-10-CM | POA: Diagnosis not present

## 2023-03-07 DIAGNOSIS — L89153 Pressure ulcer of sacral region, stage 3: Secondary | ICD-10-CM | POA: Diagnosis not present

## 2023-03-07 DIAGNOSIS — M6281 Muscle weakness (generalized): Secondary | ICD-10-CM | POA: Diagnosis not present

## 2023-03-07 DIAGNOSIS — N10 Acute tubulo-interstitial nephritis: Secondary | ICD-10-CM | POA: Diagnosis not present

## 2023-03-08 DIAGNOSIS — N10 Acute tubulo-interstitial nephritis: Secondary | ICD-10-CM | POA: Diagnosis not present

## 2023-03-08 DIAGNOSIS — R1312 Dysphagia, oropharyngeal phase: Secondary | ICD-10-CM | POA: Diagnosis not present

## 2023-03-08 DIAGNOSIS — M6281 Muscle weakness (generalized): Secondary | ICD-10-CM | POA: Diagnosis not present

## 2023-03-09 DIAGNOSIS — M6281 Muscle weakness (generalized): Secondary | ICD-10-CM | POA: Diagnosis not present

## 2023-03-09 DIAGNOSIS — N10 Acute tubulo-interstitial nephritis: Secondary | ICD-10-CM | POA: Diagnosis not present

## 2023-03-09 DIAGNOSIS — R1312 Dysphagia, oropharyngeal phase: Secondary | ICD-10-CM | POA: Diagnosis not present

## 2023-03-10 DIAGNOSIS — N10 Acute tubulo-interstitial nephritis: Secondary | ICD-10-CM | POA: Diagnosis not present

## 2023-03-10 DIAGNOSIS — R1312 Dysphagia, oropharyngeal phase: Secondary | ICD-10-CM | POA: Diagnosis not present

## 2023-03-10 DIAGNOSIS — R531 Weakness: Secondary | ICD-10-CM | POA: Diagnosis not present

## 2023-03-10 DIAGNOSIS — M6281 Muscle weakness (generalized): Secondary | ICD-10-CM | POA: Diagnosis not present

## 2023-03-10 DIAGNOSIS — R627 Adult failure to thrive: Secondary | ICD-10-CM | POA: Diagnosis not present

## 2023-03-11 DIAGNOSIS — M6281 Muscle weakness (generalized): Secondary | ICD-10-CM | POA: Diagnosis not present

## 2023-03-11 DIAGNOSIS — L89893 Pressure ulcer of other site, stage 3: Secondary | ICD-10-CM | POA: Diagnosis not present

## 2023-03-11 DIAGNOSIS — E43 Unspecified severe protein-calorie malnutrition: Secondary | ICD-10-CM | POA: Diagnosis not present

## 2023-03-11 DIAGNOSIS — N10 Acute tubulo-interstitial nephritis: Secondary | ICD-10-CM | POA: Diagnosis not present

## 2023-03-11 DIAGNOSIS — L89153 Pressure ulcer of sacral region, stage 3: Secondary | ICD-10-CM | POA: Diagnosis not present

## 2023-03-11 DIAGNOSIS — R1312 Dysphagia, oropharyngeal phase: Secondary | ICD-10-CM | POA: Diagnosis not present

## 2023-03-12 DIAGNOSIS — R1312 Dysphagia, oropharyngeal phase: Secondary | ICD-10-CM | POA: Diagnosis not present

## 2023-03-12 DIAGNOSIS — N10 Acute tubulo-interstitial nephritis: Secondary | ICD-10-CM | POA: Diagnosis not present

## 2023-03-12 DIAGNOSIS — M6281 Muscle weakness (generalized): Secondary | ICD-10-CM | POA: Diagnosis not present

## 2023-03-13 DIAGNOSIS — N10 Acute tubulo-interstitial nephritis: Secondary | ICD-10-CM | POA: Diagnosis not present

## 2023-03-13 DIAGNOSIS — M6281 Muscle weakness (generalized): Secondary | ICD-10-CM | POA: Diagnosis not present

## 2023-03-13 DIAGNOSIS — R1312 Dysphagia, oropharyngeal phase: Secondary | ICD-10-CM | POA: Diagnosis not present

## 2023-03-14 DIAGNOSIS — N10 Acute tubulo-interstitial nephritis: Secondary | ICD-10-CM | POA: Diagnosis not present

## 2023-03-14 DIAGNOSIS — R531 Weakness: Secondary | ICD-10-CM | POA: Diagnosis not present

## 2023-03-14 DIAGNOSIS — R1312 Dysphagia, oropharyngeal phase: Secondary | ICD-10-CM | POA: Diagnosis not present

## 2023-03-14 DIAGNOSIS — M6281 Muscle weakness (generalized): Secondary | ICD-10-CM | POA: Diagnosis not present

## 2023-03-14 DIAGNOSIS — D649 Anemia, unspecified: Secondary | ICD-10-CM | POA: Diagnosis not present

## 2023-03-15 DIAGNOSIS — D649 Anemia, unspecified: Secondary | ICD-10-CM | POA: Diagnosis not present

## 2023-03-18 DIAGNOSIS — L89893 Pressure ulcer of other site, stage 3: Secondary | ICD-10-CM | POA: Diagnosis not present

## 2023-03-18 DIAGNOSIS — E43 Unspecified severe protein-calorie malnutrition: Secondary | ICD-10-CM | POA: Diagnosis not present

## 2023-03-18 DIAGNOSIS — D649 Anemia, unspecified: Secondary | ICD-10-CM | POA: Diagnosis not present

## 2023-03-18 DIAGNOSIS — R531 Weakness: Secondary | ICD-10-CM | POA: Diagnosis not present

## 2023-03-18 DIAGNOSIS — L89153 Pressure ulcer of sacral region, stage 3: Secondary | ICD-10-CM | POA: Diagnosis not present

## 2023-03-18 DIAGNOSIS — N1831 Chronic kidney disease, stage 3a: Secondary | ICD-10-CM | POA: Diagnosis not present

## 2023-03-20 DIAGNOSIS — R627 Adult failure to thrive: Secondary | ICD-10-CM | POA: Diagnosis not present

## 2023-03-20 DIAGNOSIS — R29898 Other symptoms and signs involving the musculoskeletal system: Secondary | ICD-10-CM | POA: Diagnosis not present

## 2023-03-21 DIAGNOSIS — Z515 Encounter for palliative care: Secondary | ICD-10-CM | POA: Diagnosis not present

## 2023-03-22 ENCOUNTER — Telehealth: Payer: Self-pay

## 2023-03-22 DIAGNOSIS — E43 Unspecified severe protein-calorie malnutrition: Secondary | ICD-10-CM | POA: Diagnosis not present

## 2023-03-22 NOTE — Transitions of Care (Post Inpatient/ED Visit) (Signed)
03/22/2023  Name: Grant Bullock MRN: [id number] DOB: June 13, 1955  Today's TOC FU Call Status: Today's TOC FU Call Status:: Successful TOC FU Call Completed TOC FU Call Complete Date: 03/22/23 Patient's Name and Date of Birth confirmed.  Transition Care Management Follow-up Telephone Call Date of Discharge: 03/21/23 Discharge Facility: Other [facility]) Name of Other (Non[facility]) Discharge Facility: [facility] Type of Discharge: Inpatient Admission Primary Inpatient Discharge Diagnosis:: pyelonephritis How have you been since you were released from the hospital?: Better Any questions or concerns?: No  Items Reviewed: Did you receive and understand the discharge instructions provided?: Yes Medications obtained,verified, and reconciled?: Yes (Medications Reviewed) Any new allergies since your discharge?: No Dietary orders reviewed?: Yes Do you have support at home?: No  Medications Reviewed Today: Medications Reviewed Today     Reviewed by Karena Addison, LPN (Licensed Practical Nurse) on 03/22/23 at 1053  Med List Status: <None>   Medication Order Taking? Sig Documenting Provider Last Dose Status Informant           No Medications to Display                  Med List Note Sherral Hammers, CPhT 08/24/13 1351): [facility] patient            Home Care and Equipment/Supplies: Were Home Health Services Ordered?: NA Any new equipment or medical supplies ordered?: NA  Functional Questionnaire: Do you need assistance with bathing/showering or dressing?: No Do you need assistance with meal preparation?: No Do you need assistance with eating?: No Do you have difficulty maintaining continence: No Do you need assistance with getting out of bed/getting out of a chair/moving?: No Do you have difficulty managing or taking your medications?: No  Follow up appointments reviewed: PCP Follow-up appointment confirmed?: Yes Date of PCP follow-up  appointment?: 03/26/23 Follow-up Provider: [facility] Follow-up appointment confirmed?: NA Do you need transportation to your follow-up appointment?: No Do you understand care options if your condition(s) worsen?: Yes-patient verbalized understanding    SIGNATURE Karena Addison, LPN [facility] Nurse Health Advisor Direct Dial [phone]

## 2023-03-26 ENCOUNTER — Ambulatory Visit (INDEPENDENT_AMBULATORY_CARE_PROVIDER_SITE_OTHER): Payer: 59 | Admitting: Family

## 2023-03-26 ENCOUNTER — Inpatient Hospital Stay: Payer: 59 | Admitting: Family

## 2023-03-26 ENCOUNTER — Encounter: Payer: Self-pay | Admitting: Family

## 2023-03-26 VITALS — BP 110/68 | HR 75 | Ht 68.0 in | Wt 128.6 lb

## 2023-03-26 DIAGNOSIS — F209 Schizophrenia, unspecified: Secondary | ICD-10-CM

## 2023-03-26 NOTE — Progress Notes (Signed)
  Grant Bullock is a 67 y.o. male with the following history as recorded in EpicCare:  Patient Active Problem List   Diagnosis Date Noted   Schizophrenia, unspecified type (HCC) 09/20/2022   BPH (benign prostatic hyperplasia) 02/24/2014   Asthmatic bronchitis 02/24/2014   Depression 02/24/2014   Nasal congestion 02/24/2014   Right inguinal hernia 02/24/2014    No current outpatient medications on file.   No current facility-administered medications for this visit.    Allergies: Horse-derived products, Penicillins, and Shellfish allergy  Past Medical History:  Diagnosis Date   Asthma    BPH (benign prostatic hyperplasia)    Depression    Hyperlipemia    Psychosis (HCC)    Schizophrenia (HCC)    Traumatic brain injury (HCC)    Varicose vein     Past Surgical History:  Procedure Laterality Date   NASAL SINUS SURGERY     TONSILLECTOMY      No family history on file.  Social History   Tobacco Use   Smoking status: Never   Smokeless tobacco: Never  Substance Use Topics   Alcohol use: Not Currently    Subjective:   Patient was admitted with sepsis/ AKI/ BPH in July 2024; was then transferred to rehab until recently; patient notes that he left the nursing home AMA;  Patient readily admits he does not want to do any type of labs/ follow up today or any type of exam; he notes "he will only seek help when he is actively dying;"  Objective:  Vitals:   03/26/23 1421  BP: 110/68  Pulse: 75  SpO2: 98%  Weight: 128 lb 9.6 oz (58.3 kg)  Height: 5\' 8"  (1.727 m)    General: Well developed, well nourished, in no acute distress  Skin : Warm and dry.  Head: Normocephalic and atraumatic  Lungs: Respirations unlabored;  Neurologic: Alert and oriented; speech intact; face symmetrical; uses cane for stability;   Assessment:  1. Schizophrenia, unspecified type (HCC)     Plan:   Very confusing and concerning situation- He admitted he left nursing home AMA. He refused any type  of exam/ treatment today. He did not indicate any plan to hurt himself or anyone else.  I did not schedule any type of follow up for him as he told me "I know how to seek care if I am actively sick or dying." I did reach out to community nurse who has been working on long term placement options for patient to see what we may need to do to help patient;  No specific follow up scheduled at this time however;   No follow-ups on file.  No orders of the defined types were placed in this encounter.   Requested Prescriptions    No prescriptions requested or ordered in this encounter

## 2023-05-06 ENCOUNTER — Telehealth: Payer: Self-pay | Admitting: Family

## 2023-05-06 NOTE — Telephone Encounter (Signed)
Pt would like to know if pcp can fill out a handicap form for the dmv. Pt states he was told this could be done online so he would like to know if pcp can do it that way instead of him brining in the papers as it is hard for him to get around.

## 2023-05-07 ENCOUNTER — Telehealth: Payer: Self-pay | Admitting: Family

## 2023-05-07 NOTE — Telephone Encounter (Signed)
Pt dropped off document to be filled out by provider ( [employer] [place] application - 7 pages)and wanting document when ready to be emailed or faxed from document instructions. Document put at front office tray under providers name.

## 2023-05-07 NOTE — Telephone Encounter (Signed)
Called pt, was not able to leave a VM due to VM not being set up.

## 2023-05-08 ENCOUNTER — Telehealth: Payer: Self-pay | Admitting: Family

## 2023-05-08 NOTE — Telephone Encounter (Signed)
Heather, patient navigator with [facility], called to speak with nurse to discuss orders for Home Care. Please advise with Heather at [phone] exts. [phone].

## 2023-05-08 NOTE — Telephone Encounter (Signed)
Document has been placed in PCP folder.

## 2023-05-14 NOTE — Telephone Encounter (Signed)
Called Heather, left a VM asking her to give the office a call back.

## 2023-05-20 NOTE — Telephone Encounter (Signed)
Pt called to ask if he could have another copy of this paperwork as he seems to have misplaced the first copy. Please Advise.

## 2023-05-20 NOTE — Telephone Encounter (Signed)
Printed a copy of document, pt came by to pick up copy, pt also for signed copy to be mailed back to him.

## 2023-05-21 NOTE — Telephone Encounter (Signed)
Document has been signed by PCP and placed up front to mail out to pt. A copy has also been made of the form to send to scan.

## 2023-05-28 NOTE — Telephone Encounter (Signed)
Patient brought in paperwork for [place] transportation assistance on bus; patient had originally asked about handicapped placard but appears to have been confused with what he initially requested;

## 2023-07-01 DIAGNOSIS — R404 Transient alteration of awareness: Secondary | ICD-10-CM | POA: Diagnosis not present

## 2023-07-01 DIAGNOSIS — R402411 Glasgow coma scale score 13-15, in the field [EMT or ambulance]: Secondary | ICD-10-CM | POA: Diagnosis not present

## 2023-07-01 DIAGNOSIS — R55 Syncope and collapse: Secondary | ICD-10-CM | POA: Diagnosis not present

## 2023-07-01 DIAGNOSIS — I499 Cardiac arrhythmia, unspecified: Secondary | ICD-10-CM | POA: Diagnosis not present

## 2023-07-13 DEATH — deceased

## 2023-07-19 ENCOUNTER — Telehealth: Payer: Self-pay | Admitting: *Deleted

## 2023-07-19 NOTE — Telephone Encounter (Signed)
 Spoke with [employer] vital records dept and they verified with me that pt is confimed deceased and is currently in the morgue at [facility].

## 2023-07-19 NOTE — Telephone Encounter (Signed)
 Noted. Chart updated.

## 2023-08-30 ENCOUNTER — Telehealth: Payer: Self-pay | Admitting: Family

## 2023-08-30 NOTE — Telephone Encounter (Signed)
 Copied from CRM [id number]. Topic: General - Deceased Patient >> 09/10/23 10:01 AM Isabell A wrote: Name of caller: Geoffery Spruce  Date of death: 07/12/2023   Name of funeral home: [employer] engineer number of funeral home: [phone]  Provider that needs to sign form: Ria Clock, NP  Timeline for signing: by state law 5 days but it is a DSS case, please sign as soon as possible.    Patient found deceased in his home on July 12, 2023, he is an unclaimed DSS body. Time of death 11:35am.  Case #[id number]
